# Patient Record
Sex: Male | Born: 1950 | Race: White | Hispanic: No | Marital: Married | Smoking: Never smoker
Health system: Southern US, Community
[De-identification: ages and names within clinical notes are randomized; demographics above are authoritative.]

## PROBLEM LIST (undated history)

## (undated) DIAGNOSIS — I340 Nonrheumatic mitral (valve) insufficiency: Secondary | ICD-10-CM

## (undated) DIAGNOSIS — I7100 Dissection of unspecified site of aorta: Secondary | ICD-10-CM

## (undated) DIAGNOSIS — I10 Essential (primary) hypertension: Secondary | ICD-10-CM

## (undated) DIAGNOSIS — I4891 Unspecified atrial fibrillation: Secondary | ICD-10-CM

## (undated) HISTORY — DX: Dissection of unspecified site of aorta: I71.00

## (undated) HISTORY — DX: Other postprocedural complications and disorders of the circulatory system, not elsewhere classified: I48.91

## (undated) HISTORY — DX: Essential (primary) hypertension: I10

## (undated) HISTORY — DX: Unspecified atrial fibrillation: I48.91

## (undated) HISTORY — DX: Nonrheumatic mitral (valve) insufficiency: I34.0

---

## 2016-04-23 DIAGNOSIS — Z23 Encounter for immunization: Secondary | ICD-10-CM | POA: Diagnosis not present

## 2016-06-20 DIAGNOSIS — Z85828 Personal history of other malignant neoplasm of skin: Secondary | ICD-10-CM | POA: Diagnosis not present

## 2016-06-20 DIAGNOSIS — L821 Other seborrheic keratosis: Secondary | ICD-10-CM | POA: Diagnosis not present

## 2016-06-20 DIAGNOSIS — L570 Actinic keratosis: Secondary | ICD-10-CM | POA: Diagnosis not present

## 2016-07-18 DIAGNOSIS — H40053 Ocular hypertension, bilateral: Secondary | ICD-10-CM | POA: Diagnosis not present

## 2016-07-18 DIAGNOSIS — H2513 Age-related nuclear cataract, bilateral: Secondary | ICD-10-CM | POA: Diagnosis not present

## 2016-07-18 DIAGNOSIS — H40013 Open angle with borderline findings, low risk, bilateral: Secondary | ICD-10-CM | POA: Diagnosis not present

## 2016-07-18 DIAGNOSIS — H25031 Anterior subcapsular polar age-related cataract, right eye: Secondary | ICD-10-CM | POA: Diagnosis not present

## 2016-07-18 DIAGNOSIS — H21233 Degeneration of iris (pigmentary), bilateral: Secondary | ICD-10-CM | POA: Diagnosis not present

## 2016-07-18 DIAGNOSIS — H25011 Cortical age-related cataract, right eye: Secondary | ICD-10-CM | POA: Diagnosis not present

## 2016-08-17 DIAGNOSIS — H21233 Degeneration of iris (pigmentary), bilateral: Secondary | ICD-10-CM | POA: Diagnosis not present

## 2016-08-17 DIAGNOSIS — H25811 Combined forms of age-related cataract, right eye: Secondary | ICD-10-CM | POA: Diagnosis not present

## 2016-08-17 DIAGNOSIS — H25011 Cortical age-related cataract, right eye: Secondary | ICD-10-CM | POA: Diagnosis not present

## 2016-08-17 DIAGNOSIS — H40053 Ocular hypertension, bilateral: Secondary | ICD-10-CM | POA: Diagnosis not present

## 2017-03-26 DIAGNOSIS — Z23 Encounter for immunization: Secondary | ICD-10-CM | POA: Diagnosis not present

## 2017-05-16 DIAGNOSIS — H6692 Otitis media, unspecified, left ear: Secondary | ICD-10-CM | POA: Diagnosis not present

## 2017-05-16 DIAGNOSIS — Z6826 Body mass index (BMI) 26.0-26.9, adult: Secondary | ICD-10-CM | POA: Diagnosis not present

## 2017-06-19 DIAGNOSIS — L570 Actinic keratosis: Secondary | ICD-10-CM | POA: Diagnosis not present

## 2018-04-26 DIAGNOSIS — Z23 Encounter for immunization: Secondary | ICD-10-CM | POA: Diagnosis not present

## 2018-06-18 DIAGNOSIS — Z85828 Personal history of other malignant neoplasm of skin: Secondary | ICD-10-CM | POA: Diagnosis not present

## 2018-06-18 DIAGNOSIS — L821 Other seborrheic keratosis: Secondary | ICD-10-CM | POA: Diagnosis not present

## 2018-06-18 DIAGNOSIS — L570 Actinic keratosis: Secondary | ICD-10-CM | POA: Diagnosis not present

## 2019-06-22 DIAGNOSIS — L821 Other seborrheic keratosis: Secondary | ICD-10-CM | POA: Diagnosis not present

## 2019-06-22 DIAGNOSIS — Z85828 Personal history of other malignant neoplasm of skin: Secondary | ICD-10-CM | POA: Diagnosis not present

## 2019-06-22 DIAGNOSIS — D485 Neoplasm of uncertain behavior of skin: Secondary | ICD-10-CM | POA: Diagnosis not present

## 2019-06-22 DIAGNOSIS — L570 Actinic keratosis: Secondary | ICD-10-CM | POA: Diagnosis not present

## 2019-08-03 DIAGNOSIS — R05 Cough: Secondary | ICD-10-CM | POA: Diagnosis not present

## 2019-08-22 ENCOUNTER — Other Ambulatory Visit: Payer: Self-pay

## 2019-08-22 ENCOUNTER — Inpatient Hospital Stay (HOSPITAL_COMMUNITY)
Admission: EM | Admit: 2019-08-22 | Discharge: 2019-09-01 | DRG: 219 | Disposition: A | Discharge status: Home / Self Care | Payer: Medicare Other | Attending: Cardiothoracic Surgery | Admitting: Cardiothoracic Surgery

## 2019-08-22 ENCOUNTER — Encounter (HOSPITAL_COMMUNITY): Payer: Self-pay

## 2019-08-22 DIAGNOSIS — R918 Other nonspecific abnormal finding of lung field: Secondary | ICD-10-CM | POA: Diagnosis not present

## 2019-08-22 DIAGNOSIS — D688 Other specified coagulation defects: Secondary | ICD-10-CM | POA: Diagnosis not present

## 2019-08-22 DIAGNOSIS — I340 Nonrheumatic mitral (valve) insufficiency: Secondary | ICD-10-CM | POA: Diagnosis present

## 2019-08-22 DIAGNOSIS — R0689 Other abnormalities of breathing: Secondary | ICD-10-CM | POA: Diagnosis not present

## 2019-08-22 DIAGNOSIS — Z9889 Other specified postprocedural states: Secondary | ICD-10-CM

## 2019-08-22 DIAGNOSIS — J90 Pleural effusion, not elsewhere classified: Secondary | ICD-10-CM | POA: Diagnosis not present

## 2019-08-22 DIAGNOSIS — R570 Cardiogenic shock: Secondary | ICD-10-CM | POA: Diagnosis not present

## 2019-08-22 DIAGNOSIS — I471 Supraventricular tachycardia: Secondary | ICD-10-CM | POA: Diagnosis not present

## 2019-08-22 DIAGNOSIS — D696 Thrombocytopenia, unspecified: Secondary | ICD-10-CM | POA: Diagnosis present

## 2019-08-22 DIAGNOSIS — I7101 Dissection of thoracic aorta: Secondary | ICD-10-CM | POA: Diagnosis not present

## 2019-08-22 DIAGNOSIS — I7100 Dissection of unspecified site of aorta: Secondary | ICD-10-CM | POA: Diagnosis present

## 2019-08-22 DIAGNOSIS — F17220 Nicotine dependence, chewing tobacco, uncomplicated: Secondary | ICD-10-CM | POA: Diagnosis present

## 2019-08-22 DIAGNOSIS — J9602 Acute respiratory failure with hypercapnia: Secondary | ICD-10-CM | POA: Diagnosis not present

## 2019-08-22 DIAGNOSIS — I97618 Postprocedural hemorrhage and hematoma of a circulatory system organ or structure following other circulatory system procedure: Secondary | ICD-10-CM | POA: Diagnosis not present

## 2019-08-22 DIAGNOSIS — D62 Acute posthemorrhagic anemia: Secondary | ICD-10-CM | POA: Diagnosis not present

## 2019-08-22 DIAGNOSIS — E8770 Fluid overload, unspecified: Secondary | ICD-10-CM | POA: Diagnosis not present

## 2019-08-22 DIAGNOSIS — Z8616 Personal history of COVID-19: Secondary | ICD-10-CM

## 2019-08-22 DIAGNOSIS — Z452 Encounter for adjustment and management of vascular access device: Secondary | ICD-10-CM

## 2019-08-22 DIAGNOSIS — I71019 Dissection of thoracic aorta, unspecified: Secondary | ICD-10-CM

## 2019-08-22 DIAGNOSIS — I454 Nonspecific intraventricular block: Secondary | ICD-10-CM | POA: Diagnosis present

## 2019-08-22 DIAGNOSIS — I2510 Atherosclerotic heart disease of native coronary artery without angina pectoris: Secondary | ICD-10-CM | POA: Diagnosis not present

## 2019-08-22 DIAGNOSIS — U071 COVID-19: Secondary | ICD-10-CM | POA: Diagnosis not present

## 2019-08-22 DIAGNOSIS — Y838 Other surgical procedures as the cause of abnormal reaction of the patient, or of later complication, without mention of misadventure at the time of the procedure: Secondary | ICD-10-CM | POA: Diagnosis not present

## 2019-08-22 DIAGNOSIS — R001 Bradycardia, unspecified: Secondary | ICD-10-CM | POA: Diagnosis not present

## 2019-08-22 DIAGNOSIS — J9601 Acute respiratory failure with hypoxia: Secondary | ICD-10-CM | POA: Diagnosis not present

## 2019-08-22 DIAGNOSIS — I4891 Unspecified atrial fibrillation: Secondary | ICD-10-CM | POA: Diagnosis not present

## 2019-08-22 DIAGNOSIS — E876 Hypokalemia: Secondary | ICD-10-CM | POA: Diagnosis not present

## 2019-08-22 DIAGNOSIS — I7102 Dissection of abdominal aorta: Secondary | ICD-10-CM | POA: Diagnosis not present

## 2019-08-22 DIAGNOSIS — Z952 Presence of prosthetic heart valve: Secondary | ICD-10-CM | POA: Diagnosis not present

## 2019-08-22 DIAGNOSIS — Z4682 Encounter for fitting and adjustment of non-vascular catheter: Secondary | ICD-10-CM | POA: Diagnosis not present

## 2019-08-22 DIAGNOSIS — R0902 Hypoxemia: Secondary | ICD-10-CM | POA: Diagnosis not present

## 2019-08-22 DIAGNOSIS — I719 Aortic aneurysm of unspecified site, without rupture: Secondary | ICD-10-CM | POA: Diagnosis not present

## 2019-08-22 DIAGNOSIS — R079 Chest pain, unspecified: Secondary | ICD-10-CM | POA: Diagnosis not present

## 2019-08-22 DIAGNOSIS — I712 Thoracic aortic aneurysm, without rupture: Secondary | ICD-10-CM | POA: Diagnosis not present

## 2019-08-22 DIAGNOSIS — J9811 Atelectasis: Secondary | ICD-10-CM | POA: Diagnosis not present

## 2019-08-22 MED ORDER — NITROGLYCERIN IN D5W 200-5 MCG/ML-% IV SOLN
5.0000 ug/min | INTRAVENOUS | Status: DC
  Administered 2019-08-23: 5 ug/min via INTRAVENOUS
  Filled 2019-08-22: qty 250

## 2019-08-22 MED ORDER — ASPIRIN 325 MG PO TABS
325.0000 mg | ORAL_TABLET | Freq: Once | ORAL | Status: AC
  Administered 2019-08-23: 325 mg via ORAL
  Filled 2019-08-22: qty 1

## 2019-08-22 NOTE — ED Triage Notes (Signed)
Pt reports chest pain that started about 8 pm tonight. Pt described pain as heaviness, pressure, tightness rated 6/10 on pain scale. Associated sx include sob, diaphoresis, and mouth/jaw soreness.

## 2019-08-22 NOTE — ED Provider Notes (Signed)
[facility] EMERGENCY DEPARTMENT Provider Note   CSN: [id number] Arrival date & time: 08/22/19  2335   Time seen 11:47 PM  History Chief Complaint  Patient presents with  . Chest Pain    Henry Hayden is a 69 y.o. male.  HPI patient states about 8:30 PM he was sitting in his recliner and had acute onset of pain in the center of his chest that he describes as pressure.  He denies any radiation of pain but does state the roof of his mouth feels sore when he needs to rub his tongue on it.  He denies nausea or vomiting but states he had a lot of burping.  He took Tums without relief.  He states he felt short of breath and about 10 PM he broke out in a sweat.  He states when he walks his legs feel weak.  He states nothing he does makes the pain worse, nothing he did made it feel better.  He states he is never had this pain before.  His current pain is a 6 out of 10, at its worst was a 7 out of 10.  He denies any family history of coronary artery disease.  He states he has a history of borderline blood pressure but is not on medications, he denies diabetes or smoking.  He is not on any prescription medication.  He denies any change of his activity today.  Patient states he tested positive for Covid on December 29 and he had low-grade fevers and a bad cough, he states he was treated with antibiotics and steroids and those symptoms have been gone.  PCP Rory Percy, MD      History reviewed. No pertinent past medical history.  There are no problems to display for this patient.   History reviewed. No pertinent surgical history.     No family history on file.  Social History   Tobacco Use  . Smoking status: Never Smoker  . Smokeless tobacco: Current User    Types: Chew  Substance Use Topics  . Alcohol use: Not Currently  . Drug use: Never  lives at home  Lives with spouse  Home Medications Prior to Admission medications   Not on File    Allergies    Patient has no known  allergies.  Review of Systems   Review of Systems  All other systems reviewed and are negative.   Physical Exam Updated Vital Signs BP 109/70   Pulse 60   Temp (!) 97.5 F (36.4 C) (Oral)   Resp 20   Ht 6' (1.829 m)   Wt 79.8 kg   SpO2 94%   BMI 23.87 kg/m   Physical Exam Vitals and nursing note reviewed.  Constitutional:      General: He is not in acute distress.    Appearance: Normal appearance. He is well-developed. He is not ill-appearing or toxic-appearing.  HENT:     Head: Normocephalic and atraumatic.     Right Ear: External ear normal.     Left Ear: External ear normal.     Nose: Nose normal. No mucosal edema or rhinorrhea.     Mouth/Throat:     Dentition: No dental abscesses.     Pharynx: No uvula swelling.  Eyes:     Extraocular Movements: Extraocular movements intact.     Conjunctiva/sclera: Conjunctivae normal.     Pupils: Pupils are equal, round, and reactive to light.  Cardiovascular:     Rate and Rhythm: Normal rate  and regular rhythm.     Heart sounds: Normal heart sounds. No murmur. No friction rub. No gallop.   Pulmonary:     Effort: Pulmonary effort is normal. No respiratory distress.     Breath sounds: Normal breath sounds. No wheezing, rhonchi or rales.  Chest:     Chest wall: No tenderness or crepitus.       Comments: Area of pain noted Abdominal:     General: Bowel sounds are normal. There is no distension.     Palpations: Abdomen is soft.     Tenderness: There is no abdominal tenderness. There is no guarding or rebound.  Musculoskeletal:        General: No tenderness. Normal range of motion.     Cervical back: Full passive range of motion without pain, normal range of motion and neck supple.     Right lower leg: No edema.     Left lower leg: No edema.     Comments: Moves all extremities well.   Skin:    General: Skin is warm and dry.     Coloration: Skin is not pale.     Findings: No erythema or rash.  Neurological:     General:  No focal deficit present.     Mental Status: He is alert and oriented to person, place, and time.     Cranial Nerves: No cranial nerve deficit.  Psychiatric:        Mood and Affect: Mood normal. Mood is not anxious.        Speech: Speech normal.        Behavior: Behavior normal.        Thought Content: Thought content normal.     ED Results / Procedures / Treatments   Labs (all labs ordered are listed, but only abnormal results are displayed) Results for orders placed or performed during the hospital encounter of 08/22/19  Respiratory Panel by RT PCR (Flu A&B, Covid) - Nasopharyngeal Swab   Specimen: Nasopharyngeal Swab  Result Value Ref Range   SARS Coronavirus 2 by RT PCR POSITIVE (A) NEGATIVE   Influenza A by PCR NEGATIVE NEGATIVE   Influenza B by PCR NEGATIVE NEGATIVE  Comprehensive metabolic panel  Result Value Ref Range   Sodium 135 135 - 145 mmol/L   Potassium 4.0 3.5 - 5.1 mmol/L   Chloride 101 98 - 111 mmol/L   CO2 27 22 - 32 mmol/L   Glucose, Bld 130 (H) 70 - 99 mg/dL   BUN 14 8 - 23 mg/dL   Creatinine, Ser 1.18 0.61 - 1.24 mg/dL   Calcium 8.6 (L) 8.9 - 10.3 mg/dL   Total Protein 7.3 6.5 - 8.1 g/dL   Albumin 4.0 3.5 - 5.0 g/dL   AST 34 15 - 41 U/L   ALT 24 0 - 44 U/L   Alkaline Phosphatase 62 38 - 126 U/L   Total Bilirubin 0.7 0.3 - 1.2 mg/dL   GFR calc non Af Amer >60 >60 mL/min   GFR calc Af Amer >60 >60 mL/min   Anion gap 7 5 - 15  CBC with Differential  Result Value Ref Range   WBC 7.8 4.0 - 10.5 K/uL   RBC 4.46 4.22 - 5.81 MIL/uL   Hemoglobin 14.2 13.0 - 17.0 g/dL   HCT 43.4 39.0 - 52.0 %   MCV 97.3 80.0 - 100.0 fL   MCH 31.8 26.0 - 34.0 pg   MCHC 32.7 30.0 - 36.0 g/dL   RDW 13.2 11.5 -  15.5 %   Platelets 171 150 - 400 K/uL   nRBC 0.0 0.0 - 0.2 %   Neutrophils Relative % 69 %   Neutro Abs 5.4 1.7 - 7.7 K/uL   Lymphocytes Relative 20 %   Lymphs Abs 1.5 0.7 - 4.0 K/uL   Monocytes Relative 9 %   Monocytes Absolute 0.7 0.1 - 1.0 K/uL   Eosinophils  Relative 1 %   Eosinophils Absolute 0.1 0.0 - 0.5 K/uL   Basophils Relative 1 %   Basophils Absolute 0.1 0.0 - 0.1 K/uL   Immature Granulocytes 0 %   Abs Immature Granulocytes 0.03 0.00 - 0.07 K/uL  D-dimer, quantitative  Result Value Ref Range   D-Dimer, Quant 1.86 (H) 0.00 - 0.50 ug/mL-FEU  APTT  Result Value Ref Range   aPTT 27 24 - 36 seconds  Protime-INR  Result Value Ref Range   Prothrombin Time 12.9 11.4 - 15.2 seconds   INR 1.0 0.8 - 1.2  Lipid panel  Result Value Ref Range   Cholesterol 155 0 - 200 mg/dL   Triglycerides 110 <150 mg/dL   HDL 47 >40 mg/dL   Total CHOL/HDL Ratio 3.3 RATIO   VLDL 22 0 - 40 mg/dL   LDL Cholesterol 86 0 - 99 mg/dL  Troponin I (High Sensitivity)  Result Value Ref Range   Troponin I (High Sensitivity) 13 <18 ng/L    Laboratory interpretation all normal except positive D-dimer, still positive Covid test    EKG EKG Interpretation  Date/Time:  Saturday August 22 2019 23:47:40 EST Ventricular Rate:  45 PR Interval:    QRS Duration: 110 QT Interval:  482 QTC Calculation: 417 R Axis:   67 Text Interpretation: Sinus bradycardia Minimal ST elevation, inferior leads Lateral leads No old tracing to compare Confirmed by Rolland Porter [phone]) on 08/23/2019 12:02:38 AM  #2 EKG  EKG Interpretation  Date/Time:  Saturday August 22 2019 23:50:39 EST Ventricular Rate:  53 PR Interval:    QRS Duration: 109 QT Interval:  494 QTC Calculation: 464 R Axis:   69 Text Interpretation: Sinus rhythm ST elevation, consider inferior injury consider lateral injury No significant change since last tracing 3 minutes before Confirmed by Rolland Porter [phone]) on 08/23/2019 12:04:03 AM        Radiology DG Chest Port 1 View  Result Date: 08/23/2019 CLINICAL DATA:  Central chest pain COVID positive EXAM: PORTABLE CHEST 1 VIEW COMPARISON:  None. FINDINGS: The heart size and mediastinal contours are within normal limits. There is prominence of the central pulmonary  vasculature. The visualized skeletal structures are unremarkable. IMPRESSION: Pulmonary vascular congestion. Electronically Signed   By: Prudencio Pair M.D.   On: 08/23/2019 00:23   CT Angio Chest PE W/Cm &/Or Wo Cm  Result Date: 08/23/2019 CLINICAL DATA:  Chest pain. Positive D-dimer. COVID positive in December. EXAM: CT ANGIOGRAPHY CHEST WITH CONTRAST TECHNIQUE: Multidetector CT imaging of the chest was performed using the standard protocol during bolus administration of intravenous contrast. Multiplanar CT image reconstructions and MIPs were obtained to evaluate the vascular anatomy. CONTRAST:  113mL OMNIPAQUE IOHEXOL 350 MG/ML SOLN COMPARISON:  None. FINDINGS: Cardiovascular: Contrast injection is sufficient to demonstrate satisfactory opacification of the pulmonary arteries to the segmental level.The main pulmonary artery is not significantly dilated. 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 The dissection begins approximately 2.5 cm superior to the aortic valve. The dissection is suboptimally evaluated secondary to contrast bolus timing. The arch vessels appear to be grossly patent,  however extension of the dissection flap into these vessels is difficult to entirely exclude. The ascending aorta measures approximately 4.5 cm in diameter and is aneurysmal at this diameter. The dissection appears to terminate at the level of the mid descending thoracic aorta. The dissection does not appear to extend into the abdominal aorta. The heart size is normal. There is no significant pericardial effusion. Advanced coronary artery calcifications are noted. There is some mild reflux of contrast in the IVC. There is significant right atrial enlargement. Mediastinum/Nodes: --No mediastinal or hilar lymphadenopathy. --No axillary lymphadenopathy. --No supraclavicular lymphadenopathy. --Normal thyroid gland. --The esophagus is unremarkable Lungs/Pleura: There are pulmonary opacities in the right lower  lobe. There are few subtle ground-glass airspace opacities in the left lower lobe. There is no pneumothorax or large pleural effusion. The trachea is unremarkable. Upper Abdomen: There is a nonobstructing stone in the upper pole the right kidney. There is some narrowing at the origin of the celiac axis of doubtful clinical significance. Musculoskeletal: No chest wall abnormality. No acute or significant osseous findings. Review of the MIP images confirms the above findings. IMPRESSION: 1. No acute pulmonary embolism. 2. Acute appearing Stanford type A dissection as detailed above. This dissection is suboptimally evaluated on this exam secondary to contrast timing. The arch vessels appear to be grossly patent, but are suboptimally evaluated. 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 3. Subtle reflux of contrast in the IVC suggestive right-sided heart failure. 4. Significant right atrial enlargement is noted. 5. Mild cardiomegaly with coronary artery disease. 6. Bibasilar airspace opacities, right worse than left may represent residual infiltrates in the setting of a prior COVID-19 diagnosis. 7. Nonobstructing stone in the upper pole the right kidney. These results were called by telephone at the time of interpretation on 08/23/2019 at 2:03 am to provider Garnetta Fedrick , who verbally acknowledged these results. Electronically Signed   By: Constance Holster M.D.   On: 08/23/2019 02:07      Procedures .Critical Care Performed by: Rolland Porter, MD Authorized by: Rolland Porter, MD   Critical care provider statement:    Critical care time (minutes):  40   Critical care was necessary to treat or prevent imminent or life-threatening deterioration of the following conditions:  Circulatory failure   Critical care was time spent personally by me on the following activities:  Discussions with consultants, examination of patient, obtaining history from patient or surrogate, ordering and review of  laboratory studies, ordering and review of radiographic studies, pulse oximetry and re-evaluation of patient's condition   (including critical care time)  Medications Ordered in ED Medications  nitroGLYCERIN 50 mg in dextrose 5 % 250 mL (0.2 mg/mL) infusion (20 mcg/min Intravenous Rate/Dose Verify 08/23/19 0207)  0.9 %  sodium chloride infusion (has no administration in time range)  morphine 2 MG/ML injection 2 mg (has no administration in time range)  aspirin tablet 325 mg (325 mg Oral Given 08/23/19 0005)  heparin bolus via infusion 4,000 Units (4,000 Units Intravenous Bolus from Bag 08/23/19 0056)  iohexol (OMNIPAQUE) 350 MG/ML injection 100 mL (100 mLs Intravenous Contrast Given 08/23/19 0147)  protamine injection 50 mg (50 mg Intravenous Given 08/23/19 0218)    ED Course  I have reviewed the triage vital signs and the nursing notes.  Pertinent labs & imaging results that were available during my care of the patient were reviewed by me and considered in my medical decision making (see chart for details).    MDM Rules/Calculators/A&P  Patient has some minor changes on his EKG that are worrisome however he does not meet criteria for STEMI.  Patient was given full dose aspirin and started on nitroglycerin drip to see if that would control his pain.  12:23 AM patient was discussed with cardiologist, Dr. Launa Grill, she has reviewed his EKG and agrees that he is not a STEMI, however his EKG is of concern.  She agrees to starting patient on heparin.  12:56 AM patient is on IV nitroglycerin and starting his heparin drip.  He denies any history of bleeding disorders including vomiting blood or rectal bleeding.  He states his pain is improved and is now a 2 out of 10.  1:15 AM patient's D-dimer is positive, due to his recent Covid infection a CTA chest was done.  His initial troponin was normal.  We will see what the delta troponin looks like.  2:05 AM radiology called, patient has a  Stanford A dissection that stops in the thoracic area, it does not extend into the abdomen, he was unable to see the vasculature due to timing because it was a PE study.  Patient has had about 5000 units of heparin, the drip was stopped and he was started on protamine 50 mg IV.  Thoracic vascular surgery was consulted.  Pharmacy was also consulted about the protamine to see about repeat doses.Marland Kitchen  2:20 AM patient states he did not have any back pain prior to coming to the ED, he states he has some mild discomfort now in his back but he thought it was from having to lay flat on his back and he normally lays on his side.  Patient states he is never had to be on blood pressure medication, he denies any family history of aortic dissection.  His blood pressure has been 0000000 systolic on the nitroglycerin drip.  He continues to have 2 out of 10 chest pain and he was given IV morphine.  2:34 AM Dr. Orvan Seen, cardiothoracic surgery wants patient transported to [facility] to be admitted to [facility] ICU.  2:55 AM patient was leaving with EMS and a nurse in attendance.  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

## 2019-08-23 ENCOUNTER — Emergency Department (HOSPITAL_COMMUNITY): Payer: Medicare Other

## 2019-08-23 ENCOUNTER — Inpatient Hospital Stay (HOSPITAL_COMMUNITY): Payer: Medicare Other

## 2019-08-23 ENCOUNTER — Encounter (HOSPITAL_COMMUNITY)
Admission: EM | Disposition: A | Discharge status: Home / Self Care | Payer: Self-pay | Source: Home / Self Care | Attending: Cardiothoracic Surgery

## 2019-08-23 ENCOUNTER — Inpatient Hospital Stay (HOSPITAL_COMMUNITY): Payer: Medicare Other | Admitting: Registered Nurse

## 2019-08-23 DIAGNOSIS — Z8616 Personal history of COVID-19: Secondary | ICD-10-CM | POA: Diagnosis not present

## 2019-08-23 DIAGNOSIS — I454 Nonspecific intraventricular block: Secondary | ICD-10-CM | POA: Diagnosis present

## 2019-08-23 DIAGNOSIS — Y838 Other surgical procedures as the cause of abnormal reaction of the patient, or of later complication, without mention of misadventure at the time of the procedure: Secondary | ICD-10-CM | POA: Diagnosis not present

## 2019-08-23 DIAGNOSIS — E8770 Fluid overload, unspecified: Secondary | ICD-10-CM | POA: Diagnosis not present

## 2019-08-23 DIAGNOSIS — F17220 Nicotine dependence, chewing tobacco, uncomplicated: Secondary | ICD-10-CM | POA: Diagnosis present

## 2019-08-23 DIAGNOSIS — J9602 Acute respiratory failure with hypercapnia: Secondary | ICD-10-CM | POA: Diagnosis not present

## 2019-08-23 DIAGNOSIS — I4891 Unspecified atrial fibrillation: Secondary | ICD-10-CM | POA: Diagnosis not present

## 2019-08-23 DIAGNOSIS — D688 Other specified coagulation defects: Secondary | ICD-10-CM | POA: Diagnosis not present

## 2019-08-23 DIAGNOSIS — I97618 Postprocedural hemorrhage and hematoma of a circulatory system organ or structure following other circulatory system procedure: Secondary | ICD-10-CM | POA: Diagnosis not present

## 2019-08-23 DIAGNOSIS — R570 Cardiogenic shock: Secondary | ICD-10-CM | POA: Diagnosis not present

## 2019-08-23 DIAGNOSIS — J9601 Acute respiratory failure with hypoxia: Secondary | ICD-10-CM | POA: Diagnosis not present

## 2019-08-23 DIAGNOSIS — I471 Supraventricular tachycardia: Secondary | ICD-10-CM | POA: Diagnosis not present

## 2019-08-23 DIAGNOSIS — D62 Acute posthemorrhagic anemia: Secondary | ICD-10-CM | POA: Diagnosis not present

## 2019-08-23 DIAGNOSIS — D696 Thrombocytopenia, unspecified: Secondary | ICD-10-CM | POA: Diagnosis present

## 2019-08-23 DIAGNOSIS — I2510 Atherosclerotic heart disease of native coronary artery without angina pectoris: Secondary | ICD-10-CM | POA: Diagnosis present

## 2019-08-23 DIAGNOSIS — I7102 Dissection of abdominal aorta: Secondary | ICD-10-CM

## 2019-08-23 DIAGNOSIS — Z9889 Other specified postprocedural states: Secondary | ICD-10-CM

## 2019-08-23 DIAGNOSIS — J90 Pleural effusion, not elsewhere classified: Secondary | ICD-10-CM | POA: Diagnosis not present

## 2019-08-23 DIAGNOSIS — I340 Nonrheumatic mitral (valve) insufficiency: Secondary | ICD-10-CM | POA: Diagnosis not present

## 2019-08-23 DIAGNOSIS — I7101 Dissection of thoracic aorta: Secondary | ICD-10-CM | POA: Diagnosis present

## 2019-08-23 DIAGNOSIS — I7100 Dissection of unspecified site of aorta: Secondary | ICD-10-CM | POA: Diagnosis present

## 2019-08-23 DIAGNOSIS — E876 Hypokalemia: Secondary | ICD-10-CM | POA: Diagnosis not present

## 2019-08-23 HISTORY — PX: AORTIC VALVE REPAIR: SHX6306

## 2019-08-23 HISTORY — PX: MITRAL VALVE REPAIR: SHX2039

## 2019-08-23 LAB — GLUCOSE, CAPILLARY
Glucose-Capillary: 218 mg/dL — ABNORMAL HIGH (ref 70–99)
Glucose-Capillary: 172 mg/dL — ABNORMAL HIGH (ref 70–99)
Glucose-Capillary: 171 mg/dL — ABNORMAL HIGH (ref 70–99)
Glucose-Capillary: 144 mg/dL — ABNORMAL HIGH (ref 70–99)
Glucose-Capillary: 157 mg/dL — ABNORMAL HIGH (ref 70–99)
Glucose-Capillary: 148 mg/dL — ABNORMAL HIGH (ref 70–99)

## 2019-08-23 LAB — POCT I-STAT, CHEM 8
BUN: 12 mg/dL (ref 8–23)
BUN: 11 mg/dL (ref 8–23)
BUN: 13 mg/dL (ref 8–23)
BUN: 11 mg/dL (ref 8–23)
Calcium, Ion: 1.23 mmol/L (ref 1.15–1.40)
Calcium, Ion: 1.19 mmol/L (ref 1.15–1.40)
Calcium, Ion: 1.05 mmol/L — ABNORMAL LOW (ref 1.15–1.40)
Calcium, Ion: 1.52 mmol/L (ref 1.15–1.40)
Chloride: 103 mmol/L (ref 98–111)
Chloride: 103 mmol/L (ref 98–111)
Chloride: 104 mmol/L (ref 98–111)
Chloride: 106 mmol/L (ref 98–111)
Creatinine, Ser: 0.9 mg/dL (ref 0.61–1.24)
Creatinine, Ser: 0.9 mg/dL (ref 0.61–1.24)
Creatinine, Ser: 0.7 mg/dL (ref 0.61–1.24)
Creatinine, Ser: 0.7 mg/dL (ref 0.61–1.24)
Glucose, Bld: 113 mg/dL — ABNORMAL HIGH (ref 70–99)
Glucose, Bld: 112 mg/dL — ABNORMAL HIGH (ref 70–99)
Glucose, Bld: 186 mg/dL — ABNORMAL HIGH (ref 70–99)
Glucose, Bld: 143 mg/dL — ABNORMAL HIGH (ref 70–99)
HCT: 35 % — ABNORMAL LOW (ref 39.0–52.0)
HCT: 30 % — ABNORMAL LOW (ref 39.0–52.0)
HCT: 26 % — ABNORMAL LOW (ref 39.0–52.0)
HCT: 25 % — ABNORMAL LOW (ref 39.0–52.0)
Hemoglobin: 11.9 g/dL — ABNORMAL LOW (ref 13.0–17.0)
Hemoglobin: 10.2 g/dL — ABNORMAL LOW (ref 13.0–17.0)
Hemoglobin: 8.8 g/dL — ABNORMAL LOW (ref 13.0–17.0)
Hemoglobin: 8.5 g/dL — ABNORMAL LOW (ref 13.0–17.0)
Potassium: 4.4 mmol/L (ref 3.5–5.1)
Potassium: 4.6 mmol/L (ref 3.5–5.1)
Potassium: 4.5 mmol/L (ref 3.5–5.1)
Potassium: 3.4 mmol/L — ABNORMAL LOW (ref 3.5–5.1)
Sodium: 138 mmol/L (ref 135–145)
Sodium: 136 mmol/L (ref 135–145)
Sodium: 134 mmol/L — ABNORMAL LOW (ref 135–145)
Sodium: 137 mmol/L (ref 135–145)
TCO2: 26 mmol/L (ref 22–32)
TCO2: 25 mmol/L (ref 22–32)
TCO2: 26 mmol/L (ref 22–32)
TCO2: 22 mmol/L (ref 22–32)

## 2019-08-23 LAB — CBC
HCT: 28 % — ABNORMAL LOW (ref 39.0–52.0)
Hemoglobin: 9.1 g/dL — ABNORMAL LOW (ref 13.0–17.0)
MCH: 30.4 pg (ref 26.0–34.0)
MCHC: 32.5 g/dL (ref 30.0–36.0)
MCV: 93.6 fL (ref 80.0–100.0)
Platelets: 44 10*3/uL — ABNORMAL LOW (ref 150–400)
RBC: 2.99 MIL/uL — ABNORMAL LOW (ref 4.22–5.81)
RDW: 15 % (ref 11.5–15.5)
WBC: 9.1 10*3/uL (ref 4.0–10.5)
nRBC: 0 % (ref 0.0–0.2)

## 2019-08-23 LAB — POCT I-STAT 7, (LYTES, BLD GAS, ICA,H+H)
Acid-base deficit: 2 mmol/L (ref 0.0–2.0)
Acid-base deficit: 6 mmol/L — ABNORMAL HIGH (ref 0.0–2.0)
Acid-base deficit: 6 mmol/L — ABNORMAL HIGH (ref 0.0–2.0)
Bicarbonate: 25.3 mmol/L (ref 20.0–28.0)
Bicarbonate: 25.6 mmol/L (ref 20.0–28.0)
Bicarbonate: 24.8 mmol/L (ref 20.0–28.0)
Bicarbonate: 20.8 mmol/L (ref 20.0–28.0)
Bicarbonate: 20.7 mmol/L (ref 20.0–28.0)
Calcium, Ion: 1.25 mmol/L (ref 1.15–1.40)
Calcium, Ion: 1.07 mmol/L — ABNORMAL LOW (ref 1.15–1.40)
Calcium, Ion: 1.11 mmol/L — ABNORMAL LOW (ref 1.15–1.40)
Calcium, Ion: 1.46 mmol/L — ABNORMAL HIGH (ref 1.15–1.40)
Calcium, Ion: 1.23 mmol/L (ref 1.15–1.40)
HCT: 38 % — ABNORMAL LOW (ref 39.0–52.0)
HCT: 28 % — ABNORMAL LOW (ref 39.0–52.0)
HCT: 26 % — ABNORMAL LOW (ref 39.0–52.0)
HCT: 23 % — ABNORMAL LOW (ref 39.0–52.0)
HCT: 26 % — ABNORMAL LOW (ref 39.0–52.0)
Hemoglobin: 12.9 g/dL — ABNORMAL LOW (ref 13.0–17.0)
Hemoglobin: 9.5 g/dL — ABNORMAL LOW (ref 13.0–17.0)
Hemoglobin: 8.8 g/dL — ABNORMAL LOW (ref 13.0–17.0)
Hemoglobin: 7.8 g/dL — ABNORMAL LOW (ref 13.0–17.0)
Hemoglobin: 8.8 g/dL — ABNORMAL LOW (ref 13.0–17.0)
O2 Saturation: 96 %
O2 Saturation: 100 %
O2 Saturation: 100 %
O2 Saturation: 100 %
O2 Saturation: 100 %
Patient temperature: 98.6
Potassium: 4.3 mmol/L (ref 3.5–5.1)
Potassium: 4.8 mmol/L (ref 3.5–5.1)
Potassium: 4.5 mmol/L (ref 3.5–5.1)
Potassium: 3.4 mmol/L — ABNORMAL LOW (ref 3.5–5.1)
Potassium: 3.3 mmol/L — ABNORMAL LOW (ref 3.5–5.1)
Sodium: 137 mmol/L (ref 135–145)
Sodium: 137 mmol/L (ref 135–145)
Sodium: 135 mmol/L (ref 135–145)
Sodium: 138 mmol/L (ref 135–145)
Sodium: 140 mmol/L (ref 135–145)
TCO2: 27 mmol/L (ref 22–32)
TCO2: 27 mmol/L (ref 22–32)
TCO2: 26 mmol/L (ref 22–32)
TCO2: 22 mmol/L (ref 22–32)
TCO2: 22 mmol/L (ref 22–32)
pCO2 arterial: 41.7 mmHg (ref 32.0–48.0)
pCO2 arterial: 44.7 mmHg (ref 32.0–48.0)
pCO2 arterial: 50.8 mmHg — ABNORMAL HIGH (ref 32.0–48.0)
pCO2 arterial: 45.9 mmHg (ref 32.0–48.0)
pCO2 arterial: 48 mmHg (ref 32.0–48.0)
pH, Arterial: 7.391 (ref 7.350–7.450)
pH, Arterial: 7.367 (ref 7.350–7.450)
pH, Arterial: 7.296 — ABNORMAL LOW (ref 7.350–7.450)
pH, Arterial: 7.264 — ABNORMAL LOW (ref 7.350–7.450)
pH, Arterial: 7.243 — ABNORMAL LOW (ref 7.350–7.450)
pO2, Arterial: 80 mmHg — ABNORMAL LOW (ref 83.0–108.0)
pO2, Arterial: 344 mmHg — ABNORMAL HIGH (ref 83.0–108.0)
pO2, Arterial: 567 mmHg — ABNORMAL HIGH (ref 83.0–108.0)
pO2, Arterial: 202 mmHg — ABNORMAL HIGH (ref 83.0–108.0)
pO2, Arterial: 404 mmHg — ABNORMAL HIGH (ref 83.0–108.0)

## 2019-08-23 LAB — CBC WITH DIFFERENTIAL/PLATELET
Abs Immature Granulocytes: 0.03 10*3/uL (ref 0.00–0.07)
Basophils Absolute: 0.1 10*3/uL (ref 0.0–0.1)
Basophils Relative: 1 %
Eosinophils Absolute: 0.1 10*3/uL (ref 0.0–0.5)
Eosinophils Relative: 1 %
HCT: 43.4 % (ref 39.0–52.0)
Hemoglobin: 14.2 g/dL (ref 13.0–17.0)
Immature Granulocytes: 0 %
Lymphocytes Relative: 20 %
Lymphs Abs: 1.5 10*3/uL (ref 0.7–4.0)
MCH: 31.8 pg (ref 26.0–34.0)
MCHC: 32.7 g/dL (ref 30.0–36.0)
MCV: 97.3 fL (ref 80.0–100.0)
Monocytes Absolute: 0.7 10*3/uL (ref 0.1–1.0)
Monocytes Relative: 9 %
Neutro Abs: 5.4 10*3/uL (ref 1.7–7.7)
Neutrophils Relative %: 69 %
Platelets: 171 10*3/uL (ref 150–400)
RBC: 4.46 MIL/uL (ref 4.22–5.81)
RDW: 13.2 % (ref 11.5–15.5)
WBC: 7.8 10*3/uL (ref 4.0–10.5)
nRBC: 0 % (ref 0.0–0.2)

## 2019-08-23 LAB — COMPREHENSIVE METABOLIC PANEL
ALT: 24 U/L (ref 0–44)
AST: 34 U/L (ref 15–41)
Albumin: 4 g/dL (ref 3.5–5.0)
Alkaline Phosphatase: 62 U/L (ref 38–126)
Anion gap: 7 (ref 5–15)
BUN: 14 mg/dL (ref 8–23)
CO2: 27 mmol/L (ref 22–32)
Calcium: 8.6 mg/dL — ABNORMAL LOW (ref 8.9–10.3)
Chloride: 101 mmol/L (ref 98–111)
Creatinine, Ser: 1.18 mg/dL (ref 0.61–1.24)
GFR calc Af Amer: >60 mL/min (ref >60)
GFR calc non Af Amer: >60 mL/min (ref >60)
Glucose, Bld: 130 mg/dL — ABNORMAL HIGH (ref 70–99)
Potassium: 4 mmol/L (ref 3.5–5.1)
Sodium: 135 mmol/L (ref 135–145)
Total Bilirubin: 0.7 mg/dL (ref 0.3–1.2)
Total Protein: 7.3 g/dL (ref 6.5–8.1)

## 2019-08-23 LAB — RESPIRATORY PANEL BY RT PCR (FLU A&B, COVID)
Influenza A by PCR: NEGATIVE
Influenza B by PCR: NEGATIVE
SARS Coronavirus 2 by RT PCR: POSITIVE — AB

## 2019-08-23 LAB — BASIC METABOLIC PANEL
Anion gap: 9 (ref 5–15)
BUN: 10 mg/dL (ref 8–23)
CO2: 20 mmol/L — ABNORMAL LOW (ref 22–32)
Calcium: 8.8 mg/dL — ABNORMAL LOW (ref 8.9–10.3)
Chloride: 113 mmol/L — ABNORMAL HIGH (ref 98–111)
Creatinine, Ser: 1.06 mg/dL (ref 0.61–1.24)
GFR calc Af Amer: >60 mL/min (ref >60)
GFR calc non Af Amer: >60 mL/min (ref >60)
Glucose, Bld: 160 mg/dL — ABNORMAL HIGH (ref 70–99)
Potassium: 4.8 mmol/L (ref 3.5–5.1)
Sodium: 142 mmol/L (ref 135–145)

## 2019-08-23 LAB — PROTIME-INR
INR: 1 (ref 0.8–1.2)
INR: 1.8 — ABNORMAL HIGH (ref 0.8–1.2)
Prothrombin Time: 12.9 seconds (ref 11.4–15.2)
Prothrombin Time: 20.9 seconds — ABNORMAL HIGH (ref 11.4–15.2)

## 2019-08-23 LAB — LIPID PANEL
Cholesterol: 155 mg/dL (ref 0–200)
HDL: 47 mg/dL (ref >40)
LDL Cholesterol: 86 mg/dL (ref 0–99)
Total CHOL/HDL Ratio: 3.3 RATIO
Triglycerides: 110 mg/dL (ref <150)
VLDL: 22 mg/dL (ref 0–40)

## 2019-08-23 LAB — HEMOGLOBIN A1C
Hgb A1c MFr Bld: 5.8 % — ABNORMAL HIGH (ref 4.8–5.6)
Mean Plasma Glucose: 119.76 mg/dL

## 2019-08-23 LAB — SURGICAL PCR SCREEN
MRSA, PCR: NEGATIVE
Staphylococcus aureus: NEGATIVE

## 2019-08-23 LAB — HEMOGLOBIN AND HEMATOCRIT, BLOOD
HCT: 27.2 % — ABNORMAL LOW (ref 39.0–52.0)
Hemoglobin: 9.2 g/dL — ABNORMAL LOW (ref 13.0–17.0)

## 2019-08-23 LAB — PLATELET COUNT: Platelets: 88 10*3/uL — ABNORMAL LOW (ref 150–400)

## 2019-08-23 LAB — FIBRINOGEN: Fibrinogen: 180 mg/dL — ABNORMAL LOW (ref 210–475)

## 2019-08-23 LAB — APTT
aPTT: 27 seconds (ref 24–36)
aPTT: 76 seconds — ABNORMAL HIGH (ref 24–36)

## 2019-08-23 LAB — MAGNESIUM: Magnesium: 2.1 mg/dL (ref 1.7–2.4)

## 2019-08-23 LAB — PREPARE RBC (CROSSMATCH)

## 2019-08-23 LAB — D-DIMER, QUANTITATIVE: D-Dimer, Quant: 1.86 ug/mL-FEU — ABNORMAL HIGH (ref 0.00–0.50)

## 2019-08-23 LAB — ABO/RH: ABO/RH(D): O POS

## 2019-08-23 LAB — HIV ANTIBODY (ROUTINE TESTING W REFLEX): HIV Screen 4th Generation wRfx: NONREACTIVE

## 2019-08-23 LAB — TROPONIN I (HIGH SENSITIVITY)
Troponin I (High Sensitivity): 13 ng/L (ref <18)
Troponin I (High Sensitivity): 8 ng/L (ref <18)

## 2019-08-23 SURGERY — REPAIR, AORTIC VALVE
Anesthesia: General

## 2019-08-23 MED ORDER — VASOPRESSIN 20 UNIT/ML IV SOLN
INTRAVENOUS | Status: AC
  Filled 2019-08-23: qty 1

## 2019-08-23 MED ORDER — LACTATED RINGERS IV SOLN: INTRAVENOUS | Status: DC

## 2019-08-23 MED ORDER — VANCOMYCIN HCL 1250 MG/250ML IV SOLN
1250.0000 mg | INTRAVENOUS | Status: AC
  Administered 2019-08-23: 06:00:00 1250 mg via INTRAVENOUS
  Filled 2019-08-23: qty 250

## 2019-08-23 MED ORDER — INSULIN REGULAR(HUMAN) IN NACL 100-0.9 UT/100ML-% IV SOLN
INTRAVENOUS | Status: AC
  Administered 2019-08-23: 1 [IU]/h via INTRAVENOUS
  Filled 2019-08-23: qty 100

## 2019-08-23 MED ORDER — PROPOFOL 10 MG/ML IV BOLUS
INTRAVENOUS | Status: AC
  Filled 2019-08-23: qty 20

## 2019-08-23 MED ORDER — LACTATED RINGERS IV SOLN: INTRAVENOUS | Status: DC | PRN

## 2019-08-23 MED ORDER — FENTANYL CITRATE (PF) 100 MCG/2ML IJ SOLN
50.0000 ug | INTRAMUSCULAR | Status: DC | PRN
  Administered 2019-08-23: 50 ug via INTRAVENOUS
  Filled 2019-08-23: qty 2

## 2019-08-23 MED ORDER — SODIUM CHLORIDE 0.9 % IV SOLN
1.5000 g | Freq: Two times a day (BID) | INTRAVENOUS | Status: AC
  Administered 2019-08-23 – 2019-08-25 (×4): 1.5 g via INTRAVENOUS
  Filled 2019-08-23 (×4): qty 1.5

## 2019-08-23 MED ORDER — MIDAZOLAM HCL 5 MG/5ML IJ SOLN
INTRAMUSCULAR | Status: DC | PRN
  Administered 2019-08-23: 2 mg via INTRAVENOUS
  Administered 2019-08-23: 3 mg via INTRAVENOUS
  Administered 2019-08-23: 2 mg via INTRAVENOUS
  Administered 2019-08-23: 1 mg via INTRAVENOUS
  Administered 2019-08-23: 2 mg via INTRAVENOUS

## 2019-08-23 MED ORDER — ACETAMINOPHEN 500 MG PO TABS
1000.0000 mg | ORAL_TABLET | Freq: Four times a day (QID) | ORAL | Status: AC
  Administered 2019-08-24 – 2019-08-28 (×19): 1000 mg via ORAL
  Filled 2019-08-23 (×20): qty 2

## 2019-08-23 MED ORDER — IOHEXOL 350 MG/ML SOLN
100.0000 mL | Freq: Once | INTRAVENOUS | Status: AC | PRN
  Administered 2019-08-23: 100 mL via INTRAVENOUS

## 2019-08-23 MED ORDER — DOPAMINE-DEXTROSE 3.2-5 MG/ML-% IV SOLN
0.0000 ug/kg/min | INTRAVENOUS | Status: DC
  Filled 2019-08-23: qty 250

## 2019-08-23 MED ORDER — EPINEPHRINE PF 1 MG/ML IJ SOLN
0.0000 ug/min | INTRAVENOUS | Status: DC
  Filled 2019-08-23: qty 4

## 2019-08-23 MED ORDER — DEXMEDETOMIDINE HCL IN NACL 400 MCG/100ML IV SOLN
0.0000 ug/kg/h | INTRAVENOUS | Status: DC
  Administered 2019-08-23: 0.5 ug/kg/h via INTRAVENOUS
  Filled 2019-08-23: qty 100

## 2019-08-23 MED ORDER — ALBUMIN HUMAN 5 % IV SOLN: INTRAVENOUS | Status: DC | PRN

## 2019-08-23 MED ORDER — SODIUM CHLORIDE 0.9 % IV SOLN: INTRAVENOUS | Status: DC

## 2019-08-23 MED ORDER — VANCOMYCIN HCL 1000 MG IV SOLR
INTRAVENOUS | Status: AC
  Filled 2019-08-23: qty 3000

## 2019-08-23 MED ORDER — MIDAZOLAM HCL 2 MG/2ML IJ SOLN: 2.0000 mg | INTRAMUSCULAR | Status: DC | PRN

## 2019-08-23 MED ORDER — FENTANYL CITRATE (PF) 250 MCG/5ML IJ SOLN
INTRAMUSCULAR | Status: AC
  Filled 2019-08-23: qty 5

## 2019-08-23 MED ORDER — PANTOPRAZOLE SODIUM 40 MG IV SOLR: 40.0000 mg | Freq: Every day | INTRAVENOUS | Status: DC

## 2019-08-23 MED ORDER — TRAMADOL HCL 50 MG PO TABS
50.0000 mg | ORAL_TABLET | ORAL | Status: DC | PRN
  Administered 2019-08-24: 100 mg via ORAL
  Administered 2019-08-25: 50 mg via ORAL
  Filled 2019-08-23: qty 1
  Filled 2019-08-23: qty 2

## 2019-08-23 MED ORDER — ROCURONIUM BROMIDE 10 MG/ML (PF) SYRINGE
PREFILLED_SYRINGE | INTRAVENOUS | Status: DC | PRN
  Administered 2019-08-23: 100 mg via INTRAVENOUS
  Administered 2019-08-23: 50 mg via INTRAVENOUS

## 2019-08-23 MED ORDER — POTASSIUM CHLORIDE 2 MEQ/ML IV SOLN
80.0000 meq | INTRAVENOUS | Status: DC
  Filled 2019-08-23: qty 40

## 2019-08-23 MED ORDER — MANNITOL 20 % IV SOLN
Freq: Once | INTRAVENOUS | Status: DC
  Filled 2019-08-23: qty 13

## 2019-08-23 MED ORDER — FENTANYL CITRATE (PF) 250 MCG/5ML IJ SOLN
INTRAMUSCULAR | Status: AC
  Filled 2019-08-23: qty 10

## 2019-08-23 MED ORDER — EPINEPHRINE PF 1 MG/ML IJ SOLN
0.0000 ug/min | INTRAVENOUS | Status: AC
  Administered 2019-08-23: 10:00:00 3 ug/min via INTRAVENOUS
  Filled 2019-08-23 (×2): qty 4

## 2019-08-23 MED ORDER — METOPROLOL TARTRATE 12.5 MG HALF TABLET: 12.5000 mg | ORAL_TABLET | Freq: Two times a day (BID) | ORAL | Status: DC

## 2019-08-23 MED ORDER — FENTANYL CITRATE (PF) 100 MCG/2ML IJ SOLN
12.5000 ug | INTRAMUSCULAR | Status: DC | PRN
  Administered 2019-08-24: 12.5 ug via INTRAVENOUS
  Filled 2019-08-23: qty 2

## 2019-08-23 MED ORDER — HEPARIN BOLUS VIA INFUSION
4000.0000 [IU] | Freq: Once | INTRAVENOUS | Status: AC
  Administered 2019-08-23: 4000 [IU] via INTRAVENOUS

## 2019-08-23 MED ORDER — INSULIN REGULAR(HUMAN) IN NACL 100-0.9 UT/100ML-% IV SOLN: INTRAVENOUS | Status: DC

## 2019-08-23 MED ORDER — POTASSIUM CHLORIDE 10 MEQ/50ML IV SOLN
10.0000 meq | INTRAVENOUS | Status: AC
  Administered 2019-08-23 (×3): 10 meq via INTRAVENOUS

## 2019-08-23 MED ORDER — NOREPINEPHRINE 4 MG/250ML-% IV SOLN
0.0000 ug/min | INTRAVENOUS | Status: DC
  Administered 2019-08-23: 9 ug/min via INTRAVENOUS
  Filled 2019-08-23 (×2): qty 250

## 2019-08-23 MED ORDER — SODIUM CHLORIDE 0.9 % IV SOLN
1.5000 g | INTRAVENOUS | Status: AC
  Administered 2019-08-23: 10:00:00 .75 g via INTRAVENOUS
  Administered 2019-08-23: 1.5 g via INTRAVENOUS
  Filled 2019-08-23: qty 1.5

## 2019-08-23 MED ORDER — VANCOMYCIN HCL 1000 MG IV SOLR
INTRAVENOUS | Status: AC
  Filled 2019-08-23: qty 1000

## 2019-08-23 MED ORDER — CHLORHEXIDINE GLUCONATE CLOTH 2 % EX PADS: 6.0000 | MEDICATED_PAD | Freq: Once | CUTANEOUS | Status: DC

## 2019-08-23 MED ORDER — ONDANSETRON HCL 4 MG/2ML IJ SOLN: 4.0000 mg | Freq: Four times a day (QID) | INTRAMUSCULAR | Status: DC | PRN

## 2019-08-23 MED ORDER — TEMAZEPAM 15 MG PO CAPS: 15.0000 mg | ORAL_CAPSULE | Freq: Once | ORAL | Status: DC | PRN

## 2019-08-23 MED ORDER — VANCOMYCIN HCL IN DEXTROSE 1-5 GM/200ML-% IV SOLN
1000.0000 mg | Freq: Once | INTRAVENOUS | Status: AC
  Administered 2019-08-23: 1000 mg via INTRAVENOUS

## 2019-08-23 MED ORDER — ASPIRIN 81 MG PO CHEW: 324.0000 mg | CHEWABLE_TABLET | Freq: Every day | ORAL | Status: DC

## 2019-08-23 MED ORDER — ASPIRIN EC 325 MG PO TBEC: 325.0000 mg | DELAYED_RELEASE_TABLET | Freq: Every day | ORAL | Status: DC

## 2019-08-23 MED ORDER — SODIUM CHLORIDE 0.9% FLUSH
3.0000 mL | Freq: Two times a day (BID) | INTRAVENOUS | Status: DC
  Administered 2019-08-24 – 2019-08-26 (×5): 3 mL via INTRAVENOUS

## 2019-08-23 MED ORDER — 0.9 % SODIUM CHLORIDE (POUR BTL) OPTIME
TOPICAL | Status: DC | PRN
  Administered 2019-08-23: 5000 mL

## 2019-08-23 MED ORDER — SODIUM CHLORIDE 0.9% IV SOLUTION: Freq: Once | INTRAVENOUS | Status: AC

## 2019-08-23 MED ORDER — ACETAMINOPHEN 160 MG/5ML PO SOLN: 1000.0000 mg | Freq: Four times a day (QID) | ORAL | Status: AC

## 2019-08-23 MED ORDER — BISACODYL 10 MG RE SUPP
10.0000 mg | Freq: Every day | RECTAL | Status: DC
  Filled 2019-08-23: qty 1

## 2019-08-23 MED ORDER — ACETAMINOPHEN 160 MG/5ML PO SOLN: 650.0000 mg | Freq: Once | ORAL | Status: AC

## 2019-08-23 MED ORDER — SODIUM CHLORIDE 0.9 % IV SOLN: 250.0000 mL | INTRAVENOUS | Status: DC

## 2019-08-23 MED ORDER — ACETAMINOPHEN 325 MG PO TABS: 650.0000 mg | ORAL_TABLET | ORAL | Status: DC | PRN

## 2019-08-23 MED ORDER — LACTATED RINGERS IV SOLN: 500.0000 mL | Freq: Once | INTRAVENOUS | Status: DC | PRN

## 2019-08-23 MED ORDER — PHENYLEPHRINE HCL-NACL 20-0.9 MG/250ML-% IV SOLN
30.0000 ug/min | INTRAVENOUS | Status: AC
  Administered 2019-08-23: 10:00:00 25 ug/min via INTRAVENOUS
  Filled 2019-08-23: qty 250

## 2019-08-23 MED ORDER — TRANEXAMIC ACID (OHS) PUMP PRIME SOLUTION
2.0000 mg/kg | INTRAVENOUS | Status: DC
  Filled 2019-08-23: qty 1.6

## 2019-08-23 MED ORDER — CHLORHEXIDINE GLUCONATE CLOTH 2 % EX PADS
6.0000 | MEDICATED_PAD | Freq: Once | CUTANEOUS | Status: AC
  Administered 2019-08-23: 6 via TOPICAL

## 2019-08-23 MED ORDER — PROPOFOL 10 MG/ML IV BOLUS
INTRAVENOUS | Status: DC | PRN
  Administered 2019-08-23 (×4): 50 mg via INTRAVENOUS

## 2019-08-23 MED ORDER — MAGNESIUM SULFATE 4 GM/100ML IV SOLN
4.0000 g | Freq: Once | INTRAVENOUS | Status: AC
  Administered 2019-08-23: 4 g via INTRAVENOUS
  Filled 2019-08-23: qty 100

## 2019-08-23 MED ORDER — NITROGLYCERIN IN D5W 200-5 MCG/ML-% IV SOLN: 0.0000 ug/min | INTRAVENOUS | Status: DC

## 2019-08-23 MED ORDER — CHLORHEXIDINE GLUCONATE 0.12 % MT SOLN: 15.0000 mL | Freq: Once | OROMUCOSAL | Status: DC

## 2019-08-23 MED ORDER — HEPARIN (PORCINE) 25000 UT/250ML-% IV SOLN
950.0000 [IU]/h | INTRAVENOUS | Status: DC
  Administered 2019-08-23: 950 [IU]/h via INTRAVENOUS
  Filled 2019-08-23: qty 250

## 2019-08-23 MED ORDER — METOPROLOL TARTRATE 25 MG/10 ML ORAL SUSPENSION: 12.5000 mg | Freq: Two times a day (BID) | ORAL | Status: DC

## 2019-08-23 MED ORDER — SODIUM CHLORIDE 0.9 % IV SOLN
750.0000 mg | INTRAVENOUS | Status: DC
  Filled 2019-08-23: qty 750

## 2019-08-23 MED ORDER — CHLORHEXIDINE GLUCONATE CLOTH 2 % EX PADS
6.0000 | MEDICATED_PAD | Freq: Every day | CUTANEOUS | Status: DC
  Administered 2019-08-23 – 2019-09-01 (×8): 6 via TOPICAL

## 2019-08-23 MED ORDER — COAGULATION FACTOR VIIA RECOMB 1 MG IV SOLR
90.0000 ug/kg | Freq: Once | INTRAVENOUS | Status: AC
  Administered 2019-08-23: 12:00:00 1 mg via INTRAVENOUS
  Filled 2019-08-23: qty 5

## 2019-08-23 MED ORDER — MILRINONE LACTATE IN DEXTROSE 20-5 MG/100ML-% IV SOLN
0.3000 ug/kg/min | INTRAVENOUS | Status: AC
  Administered 2019-08-23: .25 ug/kg/min via INTRAVENOUS
  Filled 2019-08-23: qty 100

## 2019-08-23 MED ORDER — SODIUM CHLORIDE 0.45 % IV SOLN: INTRAVENOUS | Status: DC | PRN

## 2019-08-23 MED ORDER — FENTANYL CITRATE (PF) 250 MCG/5ML IJ SOLN
INTRAMUSCULAR | Status: DC | PRN
  Administered 2019-08-23: 100 ug via INTRAVENOUS
  Administered 2019-08-23: 50 ug via INTRAVENOUS
  Administered 2019-08-23: 150 ug via INTRAVENOUS
  Administered 2019-08-23: 700 ug via INTRAVENOUS
  Administered 2019-08-23: 200 ug via INTRAVENOUS
  Administered 2019-08-23: 100 ug via INTRAVENOUS

## 2019-08-23 MED ORDER — LIDOCAINE HCL (CARDIAC) PF 100 MG/5ML IV SOSY
PREFILLED_SYRINGE | INTRAVENOUS | Status: DC | PRN
  Administered 2019-08-23: 100 mg via INTRAVENOUS

## 2019-08-23 MED ORDER — NITROGLYCERIN IN D5W 200-5 MCG/ML-% IV SOLN
2.0000 ug/min | INTRAVENOUS | Status: DC
  Filled 2019-08-23: qty 250

## 2019-08-23 MED ORDER — CALCIUM CHLORIDE 10 % IV SOLN
INTRAVENOUS | Status: DC | PRN
  Administered 2019-08-23 (×2): 1 g via INTRAVENOUS

## 2019-08-23 MED ORDER — MORPHINE SULFATE (PF) 2 MG/ML IV SOLN
1.0000 mg | INTRAVENOUS | Status: DC | PRN
  Administered 2019-08-23: 2 mg via INTRAVENOUS
  Filled 2019-08-23: qty 1

## 2019-08-23 MED ORDER — DEXMEDETOMIDINE HCL IN NACL 400 MCG/100ML IV SOLN
0.1000 ug/kg/h | INTRAVENOUS | Status: AC
  Administered 2019-08-23: .3 ug/kg/h via INTRAVENOUS
  Filled 2019-08-23: qty 100

## 2019-08-23 MED ORDER — MORPHINE SULFATE (PF) 2 MG/ML IV SOLN
2.0000 mg | Freq: Once | INTRAVENOUS | Status: AC
  Administered 2019-08-23: 2 mg via INTRAVENOUS
  Filled 2019-08-23: qty 1

## 2019-08-23 MED ORDER — SODIUM BICARBONATE 8.4 % IV SOLN
INTRAVENOUS | Status: DC | PRN
  Administered 2019-08-23: 50 meq via INTRAVENOUS

## 2019-08-23 MED ORDER — HYDROCORTISONE NA SUCCINATE PF 100 MG IJ SOLR
INTRAMUSCULAR | Status: DC | PRN
  Administered 2019-08-23 (×2): 125 mg via INTRAVENOUS

## 2019-08-23 MED ORDER — SODIUM BICARBONATE 8.4 % IV SOLN
100.0000 meq | Freq: Once | INTRAVENOUS | Status: AC
  Administered 2019-08-23: 100 meq via INTRAVENOUS

## 2019-08-23 MED ORDER — PLASMA-LYTE 148 IV SOLN
INTRAVENOUS | Status: AC
  Administered 2019-08-23: 500 mL
  Filled 2019-08-23: qty 2.5

## 2019-08-23 MED ORDER — METOPROLOL TARTRATE 12.5 MG HALF TABLET: 12.5000 mg | ORAL_TABLET | Freq: Once | ORAL | Status: DC

## 2019-08-23 MED ORDER — MIDAZOLAM HCL (PF) 10 MG/2ML IJ SOLN
INTRAMUSCULAR | Status: AC
  Filled 2019-08-23: qty 2

## 2019-08-23 MED ORDER — OXYCODONE HCL 5 MG PO TABS
5.0000 mg | ORAL_TABLET | ORAL | Status: DC | PRN
  Administered 2019-08-24: 5 mg via ORAL
  Filled 2019-08-23: qty 1

## 2019-08-23 MED ORDER — CHLORHEXIDINE GLUCONATE 0.12 % MT SOLN
15.0000 mL | OROMUCOSAL | Status: AC
  Administered 2019-08-23: 15 mL via OROMUCOSAL

## 2019-08-23 MED ORDER — SODIUM CHLORIDE 0.9% FLUSH: 3.0000 mL | INTRAVENOUS | Status: DC | PRN

## 2019-08-23 MED ORDER — BISACODYL 5 MG PO TBEC: 5.0000 mg | DELAYED_RELEASE_TABLET | Freq: Once | ORAL | Status: DC

## 2019-08-23 MED ORDER — PANTOPRAZOLE SODIUM 40 MG PO TBEC
40.0000 mg | DELAYED_RELEASE_TABLET | Freq: Every day | ORAL | Status: DC
  Administered 2019-08-25 – 2019-09-01 (×8): 40 mg via ORAL
  Filled 2019-08-23 (×8): qty 1

## 2019-08-23 MED ORDER — PHENYLEPHRINE HCL-NACL 20-0.9 MG/250ML-% IV SOLN
INTRAVENOUS | Status: DC | PRN
  Administered 2019-08-23: 25 ug/min via INTRAVENOUS

## 2019-08-23 MED ORDER — VANCOMYCIN HCL 1000 MG IV SOLR
INTRAVENOUS | Status: DC | PRN
  Administered 2019-08-23: 3000 mg via TOPICAL

## 2019-08-23 MED ORDER — VASOPRESSIN 20 UNIT/ML IV SOLN
INTRAVENOUS | Status: DC | PRN
  Administered 2019-08-23 (×7): 2 [IU] via INTRAVENOUS
  Administered 2019-08-23: 4 [IU] via INTRAVENOUS

## 2019-08-23 MED ORDER — SODIUM CHLORIDE 0.9 % IV SOLN
INTRAVENOUS | Status: DC
  Filled 2019-08-23: qty 30

## 2019-08-23 MED ORDER — TRANEXAMIC ACID (OHS) BOLUS VIA INFUSION
15.0000 mg/kg | INTRAVENOUS | Status: AC
  Administered 2019-08-23: 06:00:00 1197 mg via INTRAVENOUS
  Filled 2019-08-23: qty 1197

## 2019-08-23 MED ORDER — PROTAMINE SULFATE 10 MG/ML IV SOLN
50.0000 mg | Freq: Once | INTRAVENOUS | Status: AC
  Administered 2019-08-23: 50 mg via INTRAVENOUS
  Filled 2019-08-23: qty 5

## 2019-08-23 MED ORDER — HEPARIN SODIUM (PORCINE) 1000 UNIT/ML IJ SOLN
INTRAMUSCULAR | Status: DC | PRN
  Administered 2019-08-23: 23000 [IU] via INTRAVENOUS
  Administered 2019-08-23: 5000 [IU] via INTRAVENOUS

## 2019-08-23 MED ORDER — ALBUMIN HUMAN 5 % IV SOLN
250.0000 mL | INTRAVENOUS | Status: AC | PRN
  Administered 2019-08-23 – 2019-08-24 (×5): 12.5 g via INTRAVENOUS
  Filled 2019-08-23: qty 250
  Filled 2019-08-23: qty 500

## 2019-08-23 MED ORDER — SODIUM CHLORIDE 0.9 % IV BOLUS
1000.0000 mL | Freq: Once | INTRAVENOUS | Status: AC
  Administered 2019-08-23: 1000 mL via INTRAVENOUS

## 2019-08-23 MED ORDER — BISACODYL 5 MG PO TBEC
10.0000 mg | DELAYED_RELEASE_TABLET | Freq: Every day | ORAL | Status: DC
  Administered 2019-08-24 – 2019-08-31 (×7): 10 mg via ORAL
  Filled 2019-08-23 (×8): qty 2

## 2019-08-23 MED ORDER — DOCUSATE SODIUM 100 MG PO CAPS
200.0000 mg | ORAL_CAPSULE | Freq: Every day | ORAL | Status: DC
  Administered 2019-08-24 – 2019-08-31 (×7): 200 mg via ORAL
  Filled 2019-08-23 (×9): qty 2

## 2019-08-23 MED ORDER — HEMOSTATIC AGENTS (NO CHARGE) OPTIME
TOPICAL | Status: DC | PRN
  Administered 2019-08-23 (×5): 1 via TOPICAL

## 2019-08-23 MED ORDER — SODIUM CHLORIDE (PF) 0.9 % IJ SOLN
OROMUCOSAL | Status: DC | PRN
  Administered 2019-08-23 (×3): 4 mL via TOPICAL

## 2019-08-23 MED ORDER — TRANEXAMIC ACID 1000 MG/10ML IV SOLN
1.5000 mg/kg/h | INTRAVENOUS | Status: AC
  Administered 2019-08-23: 1.5 mg/kg/h via INTRAVENOUS
  Filled 2019-08-23: qty 25

## 2019-08-23 MED ORDER — METOPROLOL TARTRATE 5 MG/5ML IV SOLN
2.5000 mg | INTRAVENOUS | Status: DC | PRN
  Administered 2019-08-31: 5 mg via INTRAVENOUS
  Administered 2019-08-31: 2.5 mg via INTRAVENOUS
  Filled 2019-08-23 (×2): qty 5

## 2019-08-23 MED ORDER — ACETAMINOPHEN 650 MG RE SUPP
650.0000 mg | Freq: Once | RECTAL | Status: AC
  Administered 2019-08-23: 650 mg via RECTAL

## 2019-08-23 MED ORDER — FAMOTIDINE IN NACL 20-0.9 MG/50ML-% IV SOLN
20.0000 mg | Freq: Two times a day (BID) | INTRAVENOUS | Status: DC
  Administered 2019-08-23: 20 mg via INTRAVENOUS

## 2019-08-23 MED ORDER — DEXTROSE 50 % IV SOLN: 0.0000 mL | INTRAVENOUS | Status: DC | PRN

## 2019-08-23 MED ORDER — PROTAMINE SULFATE 10 MG/ML IV SOLN
INTRAVENOUS | Status: DC | PRN
  Administered 2019-08-23: 10 mg via INTRAVENOUS
  Administered 2019-08-23: 270 mg via INTRAVENOUS
  Administered 2019-08-23: 50 mg via INTRAVENOUS

## 2019-08-23 SURGICAL SUPPLY — 109 items
ADAPTER CARDIO PERF ANTE/RETRO (ADAPTER) ×3 IMPLANT
BAG DECANTER FOR FLEXI CONT (MISCELLANEOUS) ×3 IMPLANT
BLADE CLIPPER SURG (BLADE) ×3 IMPLANT
BLADE STERNUM SYSTEM 6 (BLADE) ×3 IMPLANT
BLADE SURG 11 STRL SS (BLADE) IMPLANT
BLADE SURG 15 STRL LF DISP TIS (BLADE) IMPLANT
BLADE SURG 15 STRL SS (BLADE)
CANISTER SUCT 3000ML PPV (MISCELLANEOUS) ×3 IMPLANT
CANN PRFSN .5XCNCT 15X34-48 (MISCELLANEOUS)
CANN PRFSN 3/8XCNCT ST RT ANG (MISCELLANEOUS) ×2
CANN PRFSN 3/8XRT ANG TPR 14 (MISCELLANEOUS) ×2
CANNULA GUNDRY RCSP 15FR (MISCELLANEOUS) ×3 IMPLANT
CANNULA NON VENT 18FR 12 (CANNULA) ×3 IMPLANT
CANNULA PRFSN .5XCNCT 15X34-48 (MISCELLANEOUS) IMPLANT
CANNULA PRFSN 3/8XCNCT RT ANG (MISCELLANEOUS) ×2 IMPLANT
CANNULA PRFSN 3/8XRT ANG TPR14 (MISCELLANEOUS) ×2 IMPLANT
CANNULA SUMP PERICARDIAL (CANNULA) ×3 IMPLANT
CANNULA VEN 2 STAGE (MISCELLANEOUS)
CANNULA VEN MTL TIP RT (MISCELLANEOUS) ×2
CANNULA VENOUS DUAL 34/46 (CANNULA) ×3 IMPLANT
CATH HEART VENT LEFT (CATHETERS) ×2 IMPLANT
CATH RETROPLEGIA CORONARY 14FR (CATHETERS) ×3 IMPLANT
CATH ROBINSON RED A/P 18FR (CATHETERS) ×3 IMPLANT
CATH THORACIC 28FR (CATHETERS) IMPLANT
CATH THORACIC 36FR RT ANG (CATHETERS) IMPLANT
CLIP VESOCCLUDE LG 6/CT (CLIP) ×3 IMPLANT
CLIP VESOCCLUDE MED 6/CT (CLIP) IMPLANT
CLIP VESOCCLUDE SM WIDE 24/CT (CLIP) IMPLANT
CLIP VESOCCLUDE SM WIDE 6/CT (CLIP) IMPLANT
CONN 1/2X1/2X1/2  BEN (MISCELLANEOUS) ×1
CONN 1/2X1/2X1/2 BEN (MISCELLANEOUS) ×2 IMPLANT
CONN 3/8X1/2 ST GISH (MISCELLANEOUS) ×6 IMPLANT
CONN 3/8X3/8 GISH STERILE (MISCELLANEOUS) IMPLANT
CONN ST 1/4X3/8  BEN (MISCELLANEOUS) ×1
CONN ST 1/4X3/8 BEN (MISCELLANEOUS) ×2 IMPLANT
CONN Y 3/8X3/8X3/8  BEN (MISCELLANEOUS)
CONN Y 3/8X3/8X3/8 BEN (MISCELLANEOUS) IMPLANT
DERMABOND ADVANCED (GAUZE/BANDAGES/DRESSINGS) ×2
DERMABOND ADVANCED .7 DNX12 (GAUZE/BANDAGES/DRESSINGS) ×4 IMPLANT
DRSG AQUACEL AG ADV 3.5X14 (GAUZE/BANDAGES/DRESSINGS) ×3 IMPLANT
ELECT REM PT RETURN 9FT ADLT (ELECTROSURGICAL) ×6
ELECTRODE REM PT RTRN 9FT ADLT (ELECTROSURGICAL) ×4 IMPLANT
FELT TEFLON 6X6 (MISCELLANEOUS) ×3 IMPLANT
GAUZE SPONGE 4X4 12PLY STRL (GAUZE/BANDAGES/DRESSINGS) ×6 IMPLANT
GLOVE BIO SURGEON STRL SZ 6 (GLOVE) IMPLANT
GLOVE BIO SURGEON STRL SZ 6.5 (GLOVE) IMPLANT
GLOVE BIO SURGEON STRL SZ7 (GLOVE) IMPLANT
GLOVE BIO SURGEON STRL SZ7.5 (GLOVE) IMPLANT
GOWN STRL REUS W/ TWL LRG LVL3 (GOWN DISPOSABLE) ×8 IMPLANT
GOWN STRL REUS W/TWL LRG LVL3 (GOWN DISPOSABLE) ×4
GRAFT HEMASHIELD 10MM (Graft) ×1 IMPLANT
GRAFT VASC STRG 30X10STRL (Graft) ×2 IMPLANT
GRAFT WOVEN D/V 30DX30L (Vascular Products) ×3 IMPLANT
HEMOSTAT POWDER SURGIFOAM 1G (HEMOSTASIS) ×9 IMPLANT
HEMOSTAT SURGICEL 2X4 FIBR (HEMOSTASIS) ×6 IMPLANT
INSERT FOGARTY SM (MISCELLANEOUS) ×3 IMPLANT
INSERT FOGARTY XLG (MISCELLANEOUS) ×6 IMPLANT
KIT BASIN OR (CUSTOM PROCEDURE TRAY) ×3 IMPLANT
KIT DRAINAGE VACCUM ASSIST (KITS) ×3 IMPLANT
KIT SUCTION CATH 14FR (SUCTIONS) ×3 IMPLANT
KIT TURNOVER KIT B (KITS) ×3 IMPLANT
LEAD PACING MYOCARDI (MISCELLANEOUS) ×3 IMPLANT
LINE VENT (MISCELLANEOUS) ×3 IMPLANT
LOOP VESSEL SUPERMAXI WHITE (MISCELLANEOUS) IMPLANT
NEEDLE AORTIC AIR ASPIRATING (NEEDLE) IMPLANT
NS IRRIG 1000ML POUR BTL (IV SOLUTION) ×15 IMPLANT
PACK OPEN HEART (CUSTOM PROCEDURE TRAY) ×3 IMPLANT
PAD ARMBOARD 7.5X6 YLW CONV (MISCELLANEOUS) ×6 IMPLANT
POSITIONER HEAD DONUT 9IN (MISCELLANEOUS) IMPLANT
POWDER SURGICEL 3.0 GRAM (HEMOSTASIS) ×6 IMPLANT
SEALANT SURG COSEAL 8ML (VASCULAR PRODUCTS) ×3 IMPLANT
SET CARDIOPLEGIA MPS 5001102 (MISCELLANEOUS) ×3 IMPLANT
SET VEIN GRAFT PERF (SET/KITS/TRAYS/PACK) ×3 IMPLANT
SPONGE LAP 18X18 RF (DISPOSABLE) ×3 IMPLANT
SPONGE LAP 4X18 RFD (DISPOSABLE) ×9 IMPLANT
STAPLER VISISTAT 35W (STAPLE) ×3 IMPLANT
STOPCOCK 4 WAY LG BORE MALE ST (IV SETS) IMPLANT
SUT MNCRL AB 3-0 PS2 18 (SUTURE) ×6 IMPLANT
SUT PDS AB 1 CTX 36 (SUTURE) ×6 IMPLANT
SUT PROLENE 3 0 RB 1 (SUTURE) IMPLANT
SUT PROLENE 3 0 SH 48 (SUTURE) ×9 IMPLANT
SUT PROLENE 3 0 SH DA (SUTURE) ×51 IMPLANT
SUT PROLENE 4 0 RB 1 (SUTURE) ×6
SUT PROLENE 4 0 SH DA (SUTURE) ×6 IMPLANT
SUT PROLENE 4-0 RB1 .5 CRCL 36 (SUTURE) ×12 IMPLANT
SUT STEEL STERNAL CCS#1 18IN (SUTURE) IMPLANT
SUT STEEL SZ 6 DBL 3X14 BALL (SUTURE) IMPLANT
SUT VIC AB 1 CT1 18XCR BRD 8 (SUTURE) IMPLANT
SUT VIC AB 1 CT1 8-18 (SUTURE)
SUT VIC AB 1 CTX 27 (SUTURE) IMPLANT
SUT VIC AB 2-0 CT1 27 (SUTURE) ×1
SUT VIC AB 2-0 CT1 TAPERPNT 27 (SUTURE) ×2 IMPLANT
SUT VIC AB 2-0 CTX 36 (SUTURE) IMPLANT
SUT VIC AB 3-0 SH 27 (SUTURE)
SUT VIC AB 3-0 SH 27X BRD (SUTURE) IMPLANT
SUT VIC AB 3-0 X1 27 (SUTURE) IMPLANT
SUT VICRYL 4-0 PS2 18IN ABS (SUTURE) IMPLANT
SYR 10ML KIT SKIN ADHESIVE (MISCELLANEOUS) ×3 IMPLANT
SYSTEM SAHARA CHEST DRAIN ATS (WOUND CARE) ×3 IMPLANT
TAPE CLOTH SOFT 2X10 (GAUZE/BANDAGES/DRESSINGS) ×3 IMPLANT
TAPE CLOTH SURG 4X10 WHT LF (GAUZE/BANDAGES/DRESSINGS) ×6 IMPLANT
TOWEL GREEN STERILE (TOWEL DISPOSABLE) ×3 IMPLANT
TOWEL GREEN STERILE FF (TOWEL DISPOSABLE) ×3 IMPLANT
TRAY CATH LUMEN 1 20CM STRL (SET/KITS/TRAYS/PACK) IMPLANT
TUBE CONNECTING 12X1/4 (SUCTIONS) ×3 IMPLANT
TUBE SUCT INTRACARD DLP 20F (MISCELLANEOUS) ×3 IMPLANT
VENT LEFT HEART 12002 (CATHETERS) ×3
WATER STERILE IRR 1000ML POUR (IV SOLUTION) ×6 IMPLANT
YANKAUER SUCT BULB TIP NO VENT (SUCTIONS) ×3 IMPLANT

## 2019-08-23 NOTE — Transfer of Care (Signed)
Immediate Anesthesia Transfer of Care Note  Patient: Henry Hayden  Procedure(s) Performed: REPAIR OF TYPE A AORTIC DISSECTION USING HEMASHIELF PLATINUM 30MM VASCULAR GRAFT (N/A ) Mitral Valve Repair (Mvr)  Patient Location: SICU  Anesthesia Type:General  Level of Consciousness: Patient remains intubated per anesthesia plan  Airway & Oxygen Therapy: Patient remains intubated per anesthesia plan  Post-op Assessment: Report given to RN and Post -op Vital signs reviewed and stable  Post vital signs: Reviewed and stable  Last Vitals:  Vitals Value Taken Time  BP 107/70 08/23/19 1330  Temp    Pulse 89 08/23/19 1330  Resp 18 08/23/19 1330  SpO2 99 % 08/23/19 1330  Vitals shown include unvalidated device data.  Last Pain:  Vitals:   08/23/19 0344  TempSrc: Oral  PainSc: 1          Complications: No apparent anesthesia complications

## 2019-08-23 NOTE — Progress Notes (Signed)
ANTICOAGULATION CONSULT NOTE - Initial Consult  Pharmacy Consult for heparin Indication: chest pain/ACS  No Known Allergies  Patient Measurements: Height: 6' (182.9 cm) Weight: 176 lb (79.8 kg) IBW/kg (Calculated) : 77.6 Heparin Dosing Weight: 79.8 kg  Vital Signs: Temp: 97.5 F (36.4 C) (02/06 2349) Temp Source: Oral (02/06 2349) BP: 122/82 (02/07 0020) Pulse Rate: 56 (02/07 0020)  Labs: Recent Labs    08/22/19 2345  HGB 14.2  HCT 43.4  PLT 171  APTT 27  LABPROT 12.9  INR 1.0  CREATININE 1.18  TROPONINIHS 13    Estimated Creatinine Clearance: 64.8 mL/min (by C-G formula based on SCr of 1.18 mg/dL).   Medical History: History reviewed. No pertinent past medical history.   Assessment: Henry Hayden is a 69 y.o. male who presented with acute onset of chest pain,  SOB, and diaphoresis. Patient does not have any PTA prescription medications. EKG concerning, pharmacy consulted to start heparin. CBC and coags wnl except for D-dimer of 1.86  Of note: Per Pt he previously tested positive for COVID-19 at [place] on Dec. 30th, 2020  Goal of Therapy:  Heparin level 0.3-0.7 units/ml Monitor platelets by anticoagulation protocol: Yes   Plan:  Give 4000 units bolus x 1 Start heparin infusion at 950 units/hr Check anti-Xa level in 6-8 hours and daily while on heparin Continue to monitor H&H and platelets   Thank you for allowing [place] to participate in this patients care. Jens Som, PharmD 08/23/2019,12:48 AM

## 2019-08-23 NOTE — Procedures (Signed)
Extubation Procedure Note  Patient Details:   Name: Henry Hayden DOB: [birth year] MRN: [id number]   Airway Documentation:    Vent end date: 08/23/19 Vent end time: 2050   Evaluation  O2 sats: stable throughout Complications: No apparent complications Patient did tolerate procedure well. Bilateral Breath Sounds: Clear, Diminished   Yes   Patient extubated to 4L  with humidity. Patient able to speak. No stridor noted.   VC: 1.2 L NIF: -10  [name] 08/23/2019, 8:54 PM

## 2019-08-23 NOTE — Anesthesia Procedure Notes (Signed)
Arterial Line Insertion Start/End2/01/2020 5:35 AM, 08/23/2019 5:36 AM Performed by: Jearld Pies, CRNA, CRNA  Patient location: OR. Preanesthetic checklist: patient identified, IV checked, site marked, risks and benefits discussed, surgical consent, monitors and equipment checked, pre-op evaluation, timeout performed and anesthesia consent Patient sedated Right was placed Catheter size: 20 G Hand hygiene performed , maximum sterile barriers used  and Seldinger technique used Allen's test indicative of satisfactory collateral circulation Attempts: 1 Procedure performed without using ultrasound guided technique. Following insertion, dressing applied and Biopatch. Post procedure assessment: normal  Patient tolerated the procedure well with no immediate complications.

## 2019-08-23 NOTE — Progress Notes (Signed)
Spoke with Angie in Infection Prevention, patient recovered from [place] in December, still with +PCR, OK to be in regular room. [place], Ardeth Sportsman

## 2019-08-23 NOTE — Brief Op Note (Addendum)
08/22/2019 - 08/23/2019  12:25 PM  PATIENT:  Ardine Eng  69 y.o. male  PRE-OPERATIVE DIAGNOSIS:  TYPE A AORTIC DISSECTION MITRAL REGURGITATION   POST-OPERATIVE DIAGNOSIS:   -TYPE A AORTIC DISSECTION -MITRAL REGURGITATION  PROCEDURE:  Procedure(s): -REPAIR OF TYPE A AORTIC DISSECTION USING HEMASHIELD PLATINUM 30MM VASCULAR GRAFT  -MITRAL VALVE REPAIR  SURGEON:  Wonda Olds, MD   PHYSICIAN ASSISTANT: Roddenberry  ANESTHESIA:   general  EBL:  Per anesthesia and perfusion records   BLOOD ADMINISTERED:  Plts, FFP.Cryo  DRAINS: Mediastinal drains  LOCAL MEDICATIONS USED:  NONE  SPECIMEN:  Source of Specimen:  Segment of dissected ascending aorta  DISPOSITION OF SPECIMEN:  PATHOLOGY  COUNTS:  YES  DICTATION: .Dragon Dictation  PLAN OF CARE: Admit to inpatient   PATIENT DISPOSITION:  ICU - intubated and hemodynamically stable.   Delay start of Pharmacological VTE agent (>24hrs) due to surgical blood loss or risk of bleeding: yes   Agree with BON. Lakira Ogando Z. Orvan Seen, Bunk Foss

## 2019-08-23 NOTE — Anesthesia Procedure Notes (Signed)
Central Venous Catheter Insertion Performed by: Lillia Abed, MD, anesthesiologist Patient location: Pre-op. Preanesthetic checklist: patient identified, IV checked, site marked, risks and benefits discussed, surgical consent, monitors and equipment checked, pre-op evaluation, timeout performed and anesthesia consent Position: Trendelenburg Lidocaine 1% used for infiltration and patient sedated Hand hygiene performed  and maximum sterile barriers used  Catheter size: 8.5 Fr Central line and PA cath was placed.Sheath introducer Swan type:thermodilution Procedure performed using ultrasound guided technique. Ultrasound Notes:anatomy identified, needle tip was noted to be adjacent to the nerve/plexus identified, no ultrasound evidence of intravascular and/or intraneural injection and image(s) printed for medical record Attempts: 1 Following insertion, line sutured and dressing applied. Post procedure assessment: blood return through all ports, free fluid flow and no air  Patient tolerated the procedure well with no immediate complications.

## 2019-08-23 NOTE — ED Notes (Signed)
Date and time results received: 08/23/19 0115  Test: COVID Critical Value: positive  Name of Provider Notified: Tomi Bamberger, MD  Orders Received? Or Actions Taken?: acknowledged

## 2019-08-23 NOTE — ED Notes (Signed)
Pt transported with [facility], and Lonn Georgia, RN.

## 2019-08-23 NOTE — ED Notes (Signed)
Pts wife notifies staff that she also gave pt 324mg  of chewable ASA prior to arrival. Pharmacy notified.

## 2019-08-23 NOTE — Anesthesia Postprocedure Evaluation (Signed)
Anesthesia Post Note  Patient: Social research officer, government  Procedure(s) Performed: REPAIR OF TYPE A AORTIC DISSECTION USING HEMASHIELF PLATINUM 30MM VASCULAR GRAFT (N/A ) Mitral Valve Repair (Mvr)     Patient location during evaluation: SICU Anesthesia Type: General Level of consciousness: patient remains intubated per anesthesia plan Vital Signs Assessment: post-procedure vital signs reviewed and stable Respiratory status: patient remains intubated per anesthesia plan Postop Assessment: no apparent nausea or vomiting Anesthetic complications: no    Last Vitals:  Vitals:   08/23/19 1600 08/23/19 1615  BP: 121/82 (!) 85/54  Pulse: 72 81  Resp: 18 18  Temp: (!) 34.6 C (!) 34.5 C  SpO2: 100%     Last Pain:  Vitals:   08/23/19 1600  TempSrc: Bladder  PainSc:                  Henry Hayden

## 2019-08-23 NOTE — Anesthesia Procedure Notes (Signed)
Arterial Line Insertion Start/End2/01/2020 5:25 AM, 08/23/2019 5:25 AM Performed by: Suzy Bouchard, CRNA, CRNA  Preanesthetic checklist: patient identified, IV checked, site marked, risks and benefits discussed, surgical consent, monitors and equipment checked, pre-op evaluation and timeout performed Emergency situation Patient sedated radial was placed Catheter size: 20 G Hand hygiene performed  and maximum sterile barriers used   Attempts: 1 Procedure performed without using ultrasound guided technique. Following insertion, dressing applied and Biopatch. Post procedure assessment: normal  Patient tolerated the procedure well with no immediate complications.

## 2019-08-23 NOTE — Progress Notes (Signed)
Echocardiogram 2D Echocardiogram has been performed.  Oneal Deputy Nimrod Wendt 08/23/2019, 7:33 AM

## 2019-08-23 NOTE — Anesthesia Preprocedure Evaluation (Addendum)
Anesthesia Evaluation  Patient identified by MRN, date of birth, ID band Patient awake    Reviewed: Allergy & Precautions, NPO status , Patient's Chart, lab work & pertinent test results  Airway Mallampati: I  TM Distance: >3 FB Neck ROM: Full    Dental   Pulmonary    Pulmonary exam normal        Cardiovascular + angina Normal cardiovascular exam  Type A Aortic Disection for repair.   Neuro/Psych    GI/Hepatic   Endo/Other    Renal/GU      Musculoskeletal   Abdominal   Peds  Hematology   Anesthesia Other Findings   Reproductive/Obstetrics                            Anesthesia Physical Anesthesia Plan  ASA: IV and emergent  Anesthesia Plan: General   Post-op Pain Management:    Induction: Intravenous, Rapid sequence and Cricoid pressure planned  PONV Risk Score and Plan: 2 and Treatment may vary due to age or medical condition  Airway Management Planned: Oral ETT  Additional Equipment: Arterial line, PA Cath, TEE and Ultrasound Guidance Line Placement  Intra-op Plan:   Post-operative Plan: Post-operative intubation/ventilation  Informed Consent: I have reviewed the patients History and Physical, chart, labs and discussed the procedure including the risks, benefits and alternatives for the proposed anesthesia with the patient or authorized representative who has indicated his/her understanding and acceptance.       Plan Discussed with: CRNA and Surgeon  Anesthesia Plan Comments:         Anesthesia Quick Evaluation

## 2019-08-23 NOTE — H&P (Signed)
[address]       [place] [address]             [phone]        Mehki Smithey Erwin Medical Record [id number] Date of Birth: 1950/08/05  Referring: No ref. provider found Primary Care: Rory Percy, MD Primary Cardiologist:No primary care provider on file.  Chief Complaint:    Chief Complaint  Patient presents with  . Chest Pain    History of Present Illness:      69 yo man in [place] until evening of 2/6 when he developed sudden onset CP which was associated with diaphoresis, SOB, and nausea. He had never experienced such before. Due to the severity of the pain, he presented to ED. Work-up at [facility] demonstrated Type A Aortic Dissection. Hemodynamically stable and pain resolved with NTG. Txferred to [facility] for repair, remains stable and now sx-free. Importantly, he was COVID-19 positive in late Dec 2020, was treated with abx and steroids, and has been sx free for several weeks, yet his COVID testing remains positive. He is not considered infective.  Current Activity/ Functional Status: Patient will be independent with mobility/ambulation, transfers, ADL's, IADL's.   Zubrod Score: At the time of surgery this patient's most appropriate activity status/level should be described as: []     0    Normal activity, no symptoms []     1    Restricted in physical strenuous activity but ambulatory, able to do out light work []     2    Ambulatory and capable of self care, unable to do work activities, up and about                 more than 50%  Of the time                            []     3    Only limited self care, in bed greater than 50% of waking hours []     4    Completely disabled, no self care, confined to bed or chair []     5    Moribund  History reviewed. No pertinent past medical history.  History reviewed. No pertinent surgical history.  Social History   Tobacco Use  Smoking Status Never Smoker  Smokeless Tobacco Current User  . Types: Chew      Social History   Substance and Sexual Activity  Alcohol Use Not Currently     No Known Allergies  Current Facility-Administered Medications  Medication Dose Route Frequency Provider Last Rate Last Admin  . 0.9 %  sodium chloride infusion   Intravenous Continuous Rolland Porter, MD      . acetaminophen (TYLENOL) tablet 650 mg  650 mg Oral Q4H PRN Stepheny Canal, Glenice Bow, MD      . bisacodyl (DULCOLAX) EC tablet 5 mg  5 mg Oral Once Wonda Olds, MD      . Derrill Memo ON 08/24/2019] chlorhexidine (PERIDEX) 0.12 % solution 15 mL  15 mL Mouth/Throat Once Charle Clear, Glenice Bow, MD      . Chlorhexidine Gluconate Cloth 2 % PADS 6 each  6 each Topical Once Wonda Olds, MD       And  . Chlorhexidine Gluconate Cloth 2 % PADS 6 each  6 each Topical Once Suzann Lazaro Z, MD      . dexmedetomidine (PRECEDEX) 400 MCG/100ML (4 mcg/mL) infusion  0.1-0.7  mcg/kg/hr Intravenous To OR Alik Mawson, Glenice Bow, MD      . DOPamine (INTROPIN) 800 mg in dextrose 5 % 250 mL (3.2 mg/mL) infusion  0-10 mcg/kg/min Intravenous To OR Jezabelle Chisolm, Kerri Kovacik Z, MD      . EPINEPHrine (ADRENALIN) 4 mg in dextrose 5 % 250 mL (0.016 mg/mL) infusion  0-10 mcg/min Intravenous To OR Ingri Diemer Z, MD      . heparin 2,500 Units, papaverine 30 mg in electrolyte-148 (PLASMALYTE-148) 500 mL irrigation   Irrigation To OR Ennis Delpozo, Leahmarie Gasiorowski Z, MD      . heparin 30,000 units/NS 1000 mL solution for CELLSAVER   Other To OR Artisha Capri, Glenice Bow, MD      . insulin regular, human (MYXREDLIN) 100 units/ 100 mL infusion   Intravenous To OR Lavontae Cornia, Glenice Bow, MD      . Burgess Amor Blood Cardioplegia vial (lidocaine/magnesium/mannitol 0.26g-4g-6.4g)   Intracoronary Once Wonda Olds, MD      . Derrill Memo ON 08/24/2019] metoprolol tartrate (LOPRESSOR) tablet 12.5 mg  12.5 mg Oral Once Delvonte Berenson, Glenice Bow, MD      . milrinone (PRIMACOR) 20 MG/100 ML (0.2 mg/mL) infusion  0.3 mcg/kg/min Intravenous To OR Govanni Plemons Z, MD      . nitroGLYCERIN 50 mg in dextrose 5 %  250 mL (0.2 mg/mL) infusion  5-200 mcg/min Intravenous Continuous Tomi Bamberger, Iva, MD 7.5 mL/hr at 08/23/19 0300 25 mcg/min at 08/23/19 0300  . nitroGLYCERIN 50 mg in dextrose 5 % 250 mL (0.2 mg/mL) infusion  2-200 mcg/min Intravenous To OR Trudie Cervantes, Glenice Bow, MD      . ondansetron (ZOFRAN) injection 4 mg  4 mg Intravenous Q6H PRN [place], Glenice Bow, MD      . pantoprazole (PROTONIX) injection 40 mg  40 mg Intravenous QHS Raidon Swanner Z, MD      . phenylephrine (NEOSYNEPHRINE) 20-0.9 MG/250ML-% infusion  30-200 mcg/min Intravenous To OR Nieves Chapa Z, MD      . potassium chloride injection 80 mEq  80 mEq Other To OR Carmon Brigandi, Glenice Bow, MD      . temazepam (RESTORIL) capsule 15 mg  15 mg Oral Once PRN Wise Fees, Glenice Bow, MD      . tranexamic acid (CYKLOKAPRON) 2,500 mg in sodium chloride 0.9 % 250 mL (10 mg/mL) infusion  1.5 mg/kg/hr Intravenous To OR Arnecia Ector, Glenice Bow, MD      . tranexamic acid (CYKLOKAPRON) bolus via infusion - over 30 minutes 1,197 mg  15 mg/kg Intravenous To OR Kristene Liberati Z, MD      . tranexamic acid (CYKLOKAPRON) pump prime solution 160 mg  2 mg/kg Intracatheter To OR Taniah Reinecke, Glenice Bow, MD        No medications prior to admission.    No family history on file.   Review of Systems:   ROS A comprehensive review of systems was negative.     Cardiac Review of Systems: Y or  [    ]= no  Chest Pain [    ]  Resting SOB [   ] Exertional SOB  [  ]  Orthopnea [  ]   Pedal Edema [   ]    Palpitations [  ] Syncope  [  ]   Presyncope [   ]  General Review of Systems: [Y] = yes [  ]=no Constitional: recent weight change [  ]; anorexia [  ]; fatigue [  ]; nausea [  ]; night sweats [  ]; fever [  ];  or chills [  ]                                                               Dental: Last Dentist visit:   Eye : blurred vision [  ]; diplopia [   ]; vision changes [  ];  Amaurosis fugax[  ]; Resp: cough [  ];  wheezing[  ];  hemoptysis[  ]; shortness of breath[  ]; paroxysmal  nocturnal dyspnea[  ]; dyspnea on exertion[  ]; or orthopnea[  ];  GI:  gallstones[  ], vomiting[  ];  dysphagia[  ]; melena[  ];  hematochezia [  ]; heartburn[  ];   Hx of  Colonoscopy[  ]; GU: kidney stones [  ]; hematuria[  ];   dysuria [  ];  nocturia[  ];  history of     obstruction [  ]; urinary frequency [  ]             Skin: rash, swelling[  ];, hair loss[  ];  peripheral edema[  ];  or itching[  ]; Musculosketetal: myalgias[  ];  joint swelling[  ];  joint erythema[  ];  joint pain[  ];  back pain[  ];  Heme/Lymph: bruising[  ];  bleeding[  ];  anemia[  ];  Neuro: TIA[  ];  headaches[  ];  stroke[  ];  vertigo[  ];  seizures[  ];   paresthesias[  ];  difficulty walking[  ];  Psych:depression[  ]; anxiety[  ];  Endocrine: diabetes[  ];  thyroid dysfunction[  ];          Physical Exam: BP 114/72   Pulse 70   Temp 98.6 F (37 C) (Oral)   Resp (!) 23   Ht 6' (1.829 m)   Wt 79.8 kg   SpO2 95%   BMI 23.87 kg/m    General appearance: alert, cooperative and no distress Head: Normocephalic, without obvious abnormality, atraumatic Neck: no adenopathy, no carotid bruit, no JVD, supple, symmetrical, trachea midline and thyroid not enlarged, symmetric, no tenderness/mass/nodules Resp: clear to auscultation bilaterally Cardio: regular rate and rhythm and systolic murmur: systolic ejection 3/6, low pitch at apex GI: soft, non-tender; bowel sounds normal; no masses,  no organomegaly Extremities: extremities normal, atraumatic, no cyanosis or edema Neurologic: Alert and oriented X 3, normal strength and tone. Normal symmetric reflexes. Normal coordination and gait  Diagnostic Studies & Laboratory data:     Recent Radiology Findings:   CT Angio Chest PE W/Cm &/Or Wo Cm  Result Date: 08/23/2019 CLINICAL DATA:  Chest pain. Positive D-dimer. COVID positive in December. EXAM: CT ANGIOGRAPHY CHEST WITH CONTRAST TECHNIQUE: Multidetector CT imaging of the chest was performed using the standard  protocol during bolus administration of intravenous contrast. Multiplanar CT image reconstructions and MIPs were obtained to evaluate the vascular anatomy. CONTRAST:  171mL OMNIPAQUE IOHEXOL 350 MG/ML SOLN COMPARISON:  None. FINDINGS: Cardiovascular: Contrast injection is sufficient to demonstrate satisfactory opacification of the pulmonary arteries to the segmental level.The main pulmonary artery is not significantly dilated. 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 The dissection begins approximately 2.5 cm superior to the aortic valve. The dissection is suboptimally evaluated secondary to contrast bolus timing. The arch vessels appear  to be grossly patent, however extension of the dissection flap into these vessels is difficult to entirely exclude. The ascending aorta measures approximately 4.5 cm in diameter and is aneurysmal at this diameter. The dissection appears to terminate at the level of the mid descending thoracic aorta. The dissection does not appear to extend into the abdominal aorta. The heart size is normal. There is no significant pericardial effusion. Advanced coronary artery calcifications are noted. There is some mild reflux of contrast in the IVC. There is significant right atrial enlargement. Mediastinum/Nodes: --No mediastinal or hilar lymphadenopathy. --No axillary lymphadenopathy. --No supraclavicular lymphadenopathy. --Normal thyroid gland. --The esophagus is unremarkable Lungs/Pleura: There are pulmonary opacities in the right lower lobe. There are few subtle ground-glass airspace opacities in the left lower lobe. There is no pneumothorax or large pleural effusion. The trachea is unremarkable. Upper Abdomen: There is a nonobstructing stone in the upper pole the right kidney. There is some narrowing at the origin of the celiac axis of doubtful clinical significance. Musculoskeletal: No chest wall abnormality. No acute or significant osseous findings. Review of the MIP  images confirms the above findings. IMPRESSION: 1. No acute pulmonary embolism. 2. Acute appearing Stanford type A dissection as detailed above. This dissection is suboptimally evaluated on this exam secondary to contrast timing. The arch vessels appear to be grossly patent, but are suboptimally evaluated. 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 3. Subtle reflux of contrast in the IVC suggestive right-sided heart failure. 4. Significant right atrial enlargement is noted. 5. Mild cardiomegaly with coronary artery disease. 6. Bibasilar airspace opacities, right worse than left may represent residual infiltrates in the setting of a prior COVID-19 diagnosis. 7. Nonobstructing stone in the upper pole the right kidney. These results were called by telephone at the time of interpretation on 08/23/2019 at 2:03 am to provider IVA KNAPP , who verbally acknowledged these results. Electronically Signed   By: Constance Holster M.D.   On: 08/23/2019 02:07   DG Chest Port 1 View  Result Date: 08/23/2019 CLINICAL DATA:  Central chest pain COVID positive EXAM: PORTABLE CHEST 1 VIEW COMPARISON:  None. FINDINGS: The heart size and mediastinal contours are within normal limits. There is prominence of the central pulmonary vasculature. The visualized skeletal structures are unremarkable. IMPRESSION: Pulmonary vascular congestion. Electronically Signed   By: Prudencio Pair M.D.   On: 08/23/2019 00:23     I have independently reviewed the above radiologic studies and discussed with the patient   Recent Lab Findings: Lab Results  Component Value Date   WBC 7.8 08/22/2019   HGB 14.2 08/22/2019   HCT 43.4 08/22/2019   PLT 171 08/22/2019   GLUCOSE 130 (H) 08/22/2019   CHOL 155 08/22/2019   TRIG 110 08/22/2019   HDL 47 08/22/2019   LDLCALC 86 08/22/2019   ALT 24 08/22/2019   AST 34 08/22/2019   NA 135 08/22/2019   K 4.0 08/22/2019   CL 101 08/22/2019   CREATININE 1.18 08/22/2019   BUN 14  08/22/2019   CO2 27 08/22/2019   INR 1.0 08/22/2019      Assessment / Plan:      Type A Aortic dissection by CT and confirmed by physical exam finding of aortic insufficiency murmur. To OR for urgent repair.     I  spent 30 minutes counseling the patient face to face.   Herson Prichard Z. Orvan Seen, MD [phone] 08/23/2019 4:09 AM

## 2019-08-23 NOTE — Progress Notes (Addendum)
NAME:  Henry Hayden, MRN:  [id number], DOB:  January 05, 1951, LOS: 0 ADMISSION DATE:  08/22/2019, CONSULTATION DATE:  08/23/2019  REFERRING MD: Christiana Fuchs, CHIEF COMPLAINT: Postoperative bleeding  HPI/course in hospital  69 year old previously healthy man who underwent  No prior history of hypertension.  Developed acute retrosternal chest pain without radiation with no nausea or vomiting.  Generalized weakness.  Brought to ED and found to have type a dissection sparing the valve.  2/7 underwent repair of type a aortic dissection with hemiarch repair and repair of mitral valve for mitral regurgitation.  20 minutes circulatory arrest. CBP 2h39min. Able to come off pump easily, normal LV by echo.  Volume responsive.  Received 3200 mL of fluid in addition to 2300 mL of Cell Saver.  Unable to obtain coagulation studies intraoperatively reversed empirically with FFP cryoprecipitate, tranexamic acid protamine.  Past Medical History  History reviewed. No pertinent past medical history.  History reviewed. No pertinent surgical history.   Review of Systems:   Review of Systems  Unable to perform ROS: Critical illness    Social History   reports that he has never smoked. His smokeless tobacco use includes chew. He reports previous alcohol use. He reports that he does not use drugs.   Family History   His family history is not on file.   Allergies No Known Allergies   Home Medications  Prior to Admission medications   Not on File     Interim history/subjective:  350 mL out from chest tube in first hour.  Labile blood pressure.  Objective   Blood pressure 115/78, pulse 76, temperature 98.6 F (37 C), temperature source Oral, resp. rate 15, height 6' (1.829 m), weight 80.4 kg, SpO2 94 %.        Intake/Output Summary (Last 24 hours) at 08/23/2019 1422 Last data filed at 08/23/2019 1300 Gross per 24 hour  Intake 9374.12 ml  Output 3450 ml  Net 5924.12 ml   Filed Weights   08/22/19 2350  08/23/19 0344  Weight: 79.8 kg 80.4 kg    Examination: Physical Exam Constitutional:      Comments: Intubated and sedated  HENT:     Head: Normocephalic and atraumatic.  Neck:     Trachea: No tracheal deviation.  Cardiovascular:     Rate and Rhythm: Normal rate and regular rhythm.     Pulses:          Posterior tibial pulses are 2+ on the right side and 2+ on the left side.     Heart sounds: Normal heart sounds.     Comments:  Sternotomy incision.  No bleeding.  Will mediastinal drains.  Backup pacing in AAI Pulmonary:     Effort: Pulmonary effort is normal.     Breath sounds: Normal breath sounds.  Abdominal:     Palpations: Abdomen is soft.  Skin:    General: Skin is warm.     Capillary Refill: Capillary refill takes less than 2 seconds.  Neurological:     Comments: Sedated with dexmedetomidine.      Ancillary tests (personally reviewed)  CBC: Recent Labs  Lab 08/22/19 2345 08/23/19 0423 08/23/19 0940 08/23/19 1049 08/23/19 1054 08/23/19 1157 08/23/19 1322  WBC 7.8  --   --   --   --   --  9.1  NEUTROABS 5.4  --   --   --   --   --   --   HGB 14.2   < > 9.2* 7.8* 8.5* 8.8* 9.1*  HCT 43.4   < > 27.2* 23.0* 25.0* 26.0* 28.0*  MCV 97.3  --   --   --   --   --  93.6  PLT 171  --  88*  --   --   --  44*   < > = values in this interval not displayed.    Basic Metabolic Panel: Recent Labs  Lab 08/22/19 2345 08/23/19 0423 08/23/19 0555 08/23/19 0555 08/23/19 0713 08/23/19 0718 08/23/19 0907 08/23/19 0926 08/23/19 1049 08/23/19 1054 08/23/19 1157  NA 135   < > 138   < > 136   < > 135 134* 138 137 140  K 4.0   < > 4.4   < > 4.6   < > 4.5 4.5 3.4* 3.4* 3.3*  CL 101  --  103  --  103  --   --  104  --  106  --   CO2 27  --   --   --   --   --   --   --   --   --   --   GLUCOSE 130*  --  113*  --  112*  --   --  186*  --  143*  --   BUN 14  --  12  --  11  --   --  13  --  11  --   CREATININE 1.18  --  0.90  --  0.90  --   --  0.70  --  0.70  --   CALCIUM  8.6*  --   --   --   --   --   --   --   --   --   --    < > = values in this interval not displayed.   GFR: Estimated Creatinine Clearance: 95.7 mL/min (by C-G formula based on SCr of 0.7 mg/dL). Recent Labs  Lab 08/22/19 2345 08/23/19 1322  WBC 7.8 9.1    Liver Function Tests: Recent Labs  Lab 08/22/19 2345  AST 34  ALT 24  ALKPHOS 62  BILITOT 0.7  PROT 7.3  ALBUMIN 4.0   No results for input(s): LIPASE, AMYLASE in the last 168 hours. No results for input(s): AMMONIA in the last 168 hours.  ABG    Component Value Date/Time   PHART 7.243 (L) 08/23/2019 1157   PCO2ART 48.0 08/23/2019 1157   PO2ART 404.0 (H) 08/23/2019 1157   HCO3 20.7 08/23/2019 1157   TCO2 22 08/23/2019 1157   ACIDBASEDEF 6.0 (H) 08/23/2019 1157   O2SAT 100.0 08/23/2019 1157     Coagulation Profile: Recent Labs  Lab 08/22/19 2345 08/23/19 1322  INR 1.0 1.8*    Cardiac Enzymes: No results for input(s): CKTOTAL, CKMB, CKMBINDEX, TROPONINI in the last 168 hours.  HbA1C: Hgb A1c MFr Bld  Date/Time Value Ref Range Status  08/22/2019 11:45 PM 5.8 (H) 4.8 - 5.6 % Final    Comment:    (NOTE) Pre diabetes:          5.7%-6.4% Diabetes:              >6.4% Glycemic control for   <7.0% adults with diabetes     CBG: Recent Labs  Lab 08/23/19 1320  GLUCAP 218*    Assessment & Plan:   Critically ill due to cardiogenic and vasoplegic shock following emergent repair of type a dissection and mitral valve repair.  Unable to float PA catheter from left side  Labile blood pressure likely indicating episodes of pain possible intravascular volume depletion. -FloTrac for hemodynamic optimization -Remove Swan-Ganz catheter and place triple-lumen through introducer for additional vascular access -Continue to titrate epinephrine to maintain normal cardiac output -Titrate norepinephrine to maintain MAP greater than 65 -Avoid hypertension, keep systolic blood pressure less than 140. -Optimize pain  control, dosed with morphine and will add dose of IV Tylenol. -Follow CVP to optimize intravascular volume.  Fluid resuscitate with blood products over crystalloid given potential coagulopathy  Critically ill due to postoperative coagulopathy requiring administration of blood products. 350 mL in first hour decreased to 175 mL in the second hour Thrombocytopenia.  Mild elevation PTT -Platelet transfusion -If continues to bleed will consider DDAVP. Check fibrinogen and consider cryoprecipitate. - Protamine dose appears to have been appropriate for heparin dose and may independently affect APTT  Critically ill due to expected postoperative hypoxic hypercapnic respiratory failure requiring mechanical ventilation. -Continue full ventilatory support until hemostasis ensured and then proceed abbreviated SBT and extubation.  Daily Goals Checklist  Pain/Anxiety/Delirium protocol (if indicated): Dexmedetomidine and morphine as needed VAP protocol (if indicated): Bundle in place Respiratory support goals: Full ventilatory support wean per cardiac surgery protocol Blood pressure target: Keep MAP 65-95 DVT prophylaxis: SCDs Nutritional status and feeding goals: N.p.o. until extubated GI prophylaxis: Famotidine Fluid status goals: Target CVP Urinary catheter: Guide hemodynamic management Central lines: Left internal jugular introducer Glucose control: Per CT surgery protocol Mobility/therapy needs: Bedrest then progressive ambulation postextubation Antibiotic de-escalation: Perioperative antibiotics only Home medication reconciliation: Reconciliation post extubation. Daily labs: CBC BMP Code Status: Full Family Communication: Per cardiac surgery Disposition: ICU  CRITICAL CARE Performed by: Kipp Brood   Total critical care time: 60 minutes  Critical care time was exclusive of separately billable procedures and treating other patients.  Critical care was necessary to treat or prevent  imminent or life-threatening deterioration.  Critical care was time spent personally by me on the following activities: development of treatment plan with patient and/or surrogate as well as nursing, discussions with consultants, evaluation of patient's response to treatment, examination of patient, obtaining history from patient or surrogate, ordering and performing treatments and interventions, ordering and review of laboratory studies, ordering and review of radiographic studies, pulse oximetry, re-evaluation of patient's condition and participation in multidisciplinary rounds.  Kipp Brood, MD [facility] ICU Physician [place]  Pager: [phone] Mobile: [phone] After hours: [phone]  08/23/2019, 2:22 PM

## 2019-08-23 NOTE — Anesthesia Procedure Notes (Signed)
Procedure Name: Intubation Date/Time: 08/23/2019 5:42 AM Performed by: Jearld Pies, CRNA Pre-anesthesia Checklist: Patient identified, Emergency Drugs available, Suction available and Patient being monitored Patient Re-evaluated:Patient Re-evaluated prior to induction Oxygen Delivery Method: Circle System Utilized Preoxygenation: Pre-oxygenation with 100% oxygen Induction Type: IV induction Ventilation: Mask ventilation without difficulty Laryngoscope Size: Mac and 3 Grade View: Grade I Tube type: Oral Tube size: 8.0 mm Number of attempts: 1 Airway Equipment and Method: Stylet Placement Confirmation: ETT inserted through vocal cords under direct vision,  positive ETCO2 and breath sounds checked- equal and bilateral Secured at: 23 cm Tube secured with: Tape Dental Injury: Teeth and Oropharynx as per pre-operative assessment

## 2019-08-23 NOTE — ED Notes (Signed)
Pt transported to CT ?

## 2019-08-23 NOTE — ED Notes (Addendum)
Per Pt he previously tested positive for COVID-19 at [facility] on Dec. 30th, 2020. Pt doesn't have any COVID symptoms at this time. New testing was provided here for our records. Per [facility] pt doesn't need pecautions at this time.

## 2019-08-24 ENCOUNTER — Encounter: Payer: Self-pay | Admitting: *Deleted

## 2019-08-24 ENCOUNTER — Inpatient Hospital Stay (HOSPITAL_COMMUNITY): Payer: Medicare Other

## 2019-08-24 LAB — BPAM PLATELET PHERESIS
Blood Product Expiration Date: 202102082359
Blood Product Expiration Date: 202102082359
Blood Product Expiration Date: 202102092359
ISSUE DATE / TIME: 202102070953
ISSUE DATE / TIME: 202102071548
Unit Type and Rh: 6200
Unit Type and Rh: 6200
Unit Type and Rh: 9500

## 2019-08-24 LAB — PREPARE FRESH FROZEN PLASMA
Unit division: 0
Unit division: 0

## 2019-08-24 LAB — POCT I-STAT 7, (LYTES, BLD GAS, ICA,H+H)
Acid-base deficit: 7 mmol/L — ABNORMAL HIGH (ref 0.0–2.0)
Acid-base deficit: 8 mmol/L — ABNORMAL HIGH (ref 0.0–2.0)
Acid-base deficit: 5 mmol/L — ABNORMAL HIGH (ref 0.0–2.0)
Bicarbonate: 18.1 mmol/L — ABNORMAL LOW (ref 20.0–28.0)
Bicarbonate: 18.6 mmol/L — ABNORMAL LOW (ref 20.0–28.0)
Bicarbonate: 18.8 mmol/L — ABNORMAL LOW (ref 20.0–28.0)
Calcium, Ion: 1.84 mmol/L (ref 1.15–1.40)
Calcium, Ion: 1.84 mmol/L (ref 1.15–1.40)
Calcium, Ion: 1.28 mmol/L (ref 1.15–1.40)
HCT: 24 % — ABNORMAL LOW (ref 39.0–52.0)
HCT: 25 % — ABNORMAL LOW (ref 39.0–52.0)
HCT: 17 % — ABNORMAL LOW (ref 39.0–52.0)
Hemoglobin: 8.2 g/dL — ABNORMAL LOW (ref 13.0–17.0)
Hemoglobin: 8.5 g/dL — ABNORMAL LOW (ref 13.0–17.0)
Hemoglobin: 5.8 g/dL — CL (ref 13.0–17.0)
O2 Saturation: 98 %
O2 Saturation: 98 %
O2 Saturation: 98 %
Patient temperature: 34.5
Patient temperature: 34.6
Patient temperature: 37.3
Potassium: 3.2 mmol/L — ABNORMAL LOW (ref 3.5–5.1)
Potassium: 3.3 mmol/L — ABNORMAL LOW (ref 3.5–5.1)
Potassium: 4.7 mmol/L (ref 3.5–5.1)
Sodium: 142 mmol/L (ref 135–145)
Sodium: 142 mmol/L (ref 135–145)
Sodium: 142 mmol/L (ref 135–145)
TCO2: 19 mmol/L — ABNORMAL LOW (ref 22–32)
TCO2: 20 mmol/L — ABNORMAL LOW (ref 22–32)
TCO2: 20 mmol/L — ABNORMAL LOW (ref 22–32)
pCO2 arterial: 34.5 mmHg (ref 32.0–48.0)
pCO2 arterial: 35.7 mmHg (ref 32.0–48.0)
pCO2 arterial: 30.2 mmHg — ABNORMAL LOW (ref 32.0–48.0)
pH, Arterial: 7.299 — ABNORMAL LOW (ref 7.350–7.450)
pH, Arterial: 7.327 — ABNORMAL LOW (ref 7.350–7.450)
pH, Arterial: 7.402 (ref 7.350–7.450)
pO2, Arterial: 107 mmHg (ref 83.0–108.0)
pO2, Arterial: 109 mmHg — ABNORMAL HIGH (ref 83.0–108.0)
pO2, Arterial: 101 mmHg (ref 83.0–108.0)

## 2019-08-24 LAB — CBC
HCT: 20.7 % — ABNORMAL LOW (ref 39.0–52.0)
HCT: 20 % — ABNORMAL LOW (ref 39.0–52.0)
HCT: 22.8 % — ABNORMAL LOW (ref 39.0–52.0)
HCT: 24.8 % — ABNORMAL LOW (ref 39.0–52.0)
Hemoglobin: 7 g/dL — ABNORMAL LOW (ref 13.0–17.0)
Hemoglobin: 6.8 g/dL — CL (ref 13.0–17.0)
Hemoglobin: 7.8 g/dL — ABNORMAL LOW (ref 13.0–17.0)
Hemoglobin: 8.4 g/dL — ABNORMAL LOW (ref 13.0–17.0)
MCH: 30.4 pg (ref 26.0–34.0)
MCH: 30.8 pg (ref 26.0–34.0)
MCH: 31.1 pg (ref 26.0–34.0)
MCH: 30.8 pg (ref 26.0–34.0)
MCHC: 33.8 g/dL (ref 30.0–36.0)
MCHC: 34 g/dL (ref 30.0–36.0)
MCHC: 34.2 g/dL (ref 30.0–36.0)
MCHC: 33.9 g/dL (ref 30.0–36.0)
MCV: 90 fL (ref 80.0–100.0)
MCV: 90.5 fL (ref 80.0–100.0)
MCV: 90.8 fL (ref 80.0–100.0)
MCV: 90.8 fL (ref 80.0–100.0)
Platelets: 85 10*3/uL — ABNORMAL LOW (ref 150–400)
Platelets: 86 10*3/uL — ABNORMAL LOW (ref 150–400)
Platelets: 74 10*3/uL — ABNORMAL LOW (ref 150–400)
Platelets: 61 10*3/uL — ABNORMAL LOW (ref 150–400)
RBC: 2.3 MIL/uL — ABNORMAL LOW (ref 4.22–5.81)
RBC: 2.21 MIL/uL — ABNORMAL LOW (ref 4.22–5.81)
RBC: 2.51 MIL/uL — ABNORMAL LOW (ref 4.22–5.81)
RBC: 2.73 MIL/uL — ABNORMAL LOW (ref 4.22–5.81)
RDW: 15.6 % — ABNORMAL HIGH (ref 11.5–15.5)
RDW: 16.1 % — ABNORMAL HIGH (ref 11.5–15.5)
RDW: 15.7 % — ABNORMAL HIGH (ref 11.5–15.5)
RDW: 15.5 % (ref 11.5–15.5)
WBC: 6.9 10*3/uL (ref 4.0–10.5)
WBC: 7.7 10*3/uL (ref 4.0–10.5)
WBC: 9.1 10*3/uL (ref 4.0–10.5)
WBC: 8.3 10*3/uL (ref 4.0–10.5)
nRBC: 0 % (ref 0.0–0.2)
nRBC: 0 % (ref 0.0–0.2)
nRBC: 0 % (ref 0.0–0.2)
nRBC: 0 % (ref 0.0–0.2)

## 2019-08-24 LAB — BPAM FFP
Blood Product Expiration Date: 202102122359
Blood Product Expiration Date: 202102122359
Blood Product Expiration Date: 202102122359
ISSUE DATE / TIME: 202102071027
ISSUE DATE / TIME: 202102071027
ISSUE DATE / TIME: 202102071708
Unit Type and Rh: 5100
Unit Type and Rh: 5100
Unit Type and Rh: 600

## 2019-08-24 LAB — BASIC METABOLIC PANEL
Anion gap: 7 (ref 5–15)
Anion gap: 8 (ref 5–15)
Anion gap: 8 (ref 5–15)
BUN: 12 mg/dL (ref 8–23)
BUN: 13 mg/dL (ref 8–23)
BUN: 14 mg/dL (ref 8–23)
CO2: 23 mmol/L (ref 22–32)
CO2: 22 mmol/L (ref 22–32)
CO2: 22 mmol/L (ref 22–32)
Calcium: 8.3 mg/dL — ABNORMAL LOW (ref 8.9–10.3)
Calcium: 8.1 mg/dL — ABNORMAL LOW (ref 8.9–10.3)
Calcium: 8.2 mg/dL — ABNORMAL LOW (ref 8.9–10.3)
Chloride: 110 mmol/L (ref 98–111)
Chloride: 108 mmol/L (ref 98–111)
Chloride: 109 mmol/L (ref 98–111)
Creatinine, Ser: 1.03 mg/dL (ref 0.61–1.24)
Creatinine, Ser: 0.97 mg/dL (ref 0.61–1.24)
Creatinine, Ser: 0.91 mg/dL (ref 0.61–1.24)
GFR calc Af Amer: >60 mL/min (ref >60)
GFR calc Af Amer: >60 mL/min (ref >60)
GFR calc Af Amer: >60 mL/min (ref >60)
GFR calc non Af Amer: >60 mL/min (ref >60)
GFR calc non Af Amer: >60 mL/min (ref >60)
GFR calc non Af Amer: >60 mL/min (ref >60)
Glucose, Bld: 130 mg/dL — ABNORMAL HIGH (ref 70–99)
Glucose, Bld: 124 mg/dL — ABNORMAL HIGH (ref 70–99)
Glucose, Bld: 108 mg/dL — ABNORMAL HIGH (ref 70–99)
Potassium: 5.3 mmol/L — ABNORMAL HIGH (ref 3.5–5.1)
Potassium: 4.8 mmol/L (ref 3.5–5.1)
Potassium: 4.2 mmol/L (ref 3.5–5.1)
Sodium: 140 mmol/L (ref 135–145)
Sodium: 138 mmol/L (ref 135–145)
Sodium: 139 mmol/L (ref 135–145)

## 2019-08-24 LAB — PREPARE PLATELET PHERESIS
Unit division: 0
Unit division: 0
Unit division: 0

## 2019-08-24 LAB — PREPARE CRYOPRECIPITATE: Unit division: 0

## 2019-08-24 LAB — GLUCOSE, CAPILLARY
Glucose-Capillary: 105 mg/dL — ABNORMAL HIGH (ref 70–99)
Glucose-Capillary: 106 mg/dL — ABNORMAL HIGH (ref 70–99)
Glucose-Capillary: 117 mg/dL — ABNORMAL HIGH (ref 70–99)
Glucose-Capillary: 118 mg/dL — ABNORMAL HIGH (ref 70–99)
Glucose-Capillary: 119 mg/dL — ABNORMAL HIGH (ref 70–99)
Glucose-Capillary: 122 mg/dL — ABNORMAL HIGH (ref 70–99)
Glucose-Capillary: 122 mg/dL — ABNORMAL HIGH (ref 70–99)
Glucose-Capillary: 125 mg/dL — ABNORMAL HIGH (ref 70–99)
Glucose-Capillary: 127 mg/dL — ABNORMAL HIGH (ref 70–99)
Glucose-Capillary: 139 mg/dL — ABNORMAL HIGH (ref 70–99)
Glucose-Capillary: 82 mg/dL (ref 70–99)
Glucose-Capillary: 92 mg/dL (ref 70–99)

## 2019-08-24 LAB — PREPARE RBC (CROSSMATCH)

## 2019-08-24 LAB — BPAM CRYOPRECIPITATE
Blood Product Expiration Date: 202102071551
ISSUE DATE / TIME: 202102071028
Unit Type and Rh: 5100

## 2019-08-24 LAB — MAGNESIUM
Magnesium: 1.9 mg/dL (ref 1.7–2.4)
Magnesium: 1.7 mg/dL (ref 1.7–2.4)

## 2019-08-24 MED ORDER — AMIODARONE HCL IN DEXTROSE 360-4.14 MG/200ML-% IV SOLN
30.0000 mg/h | INTRAVENOUS | Status: DC
  Administered 2019-08-24 – 2019-08-25 (×3): 30 mg/h via INTRAVENOUS
  Filled 2019-08-24: qty 200

## 2019-08-24 MED ORDER — SODIUM CHLORIDE 0.9% IV SOLUTION: Freq: Once | INTRAVENOUS | Status: DC

## 2019-08-24 MED ORDER — SODIUM CHLORIDE 0.9% FLUSH
10.0000 mL | Freq: Two times a day (BID) | INTRAVENOUS | Status: DC
  Administered 2019-08-24 – 2019-08-25 (×3): 10 mL

## 2019-08-24 MED ORDER — AMIODARONE HCL IN DEXTROSE 360-4.14 MG/200ML-% IV SOLN
60.0000 mg/h | INTRAVENOUS | Status: DC
  Filled 2019-08-24: qty 400

## 2019-08-24 MED ORDER — ASPIRIN 81 MG PO CHEW
81.0000 mg | CHEWABLE_TABLET | Freq: Every day | ORAL | Status: DC
  Administered 2019-08-24 – 2019-09-01 (×9): 81 mg via ORAL
  Filled 2019-08-24 (×9): qty 1

## 2019-08-24 MED ORDER — THIAMINE HCL 100 MG/ML IJ SOLN
Freq: Once | INTRAVENOUS | Status: AC
  Filled 2019-08-24: qty 1000

## 2019-08-24 MED ORDER — INSULIN ASPART 100 UNIT/ML ~~LOC~~ SOLN
0.0000 [IU] | SUBCUTANEOUS | Status: DC
  Administered 2019-08-26: 2 [IU] via SUBCUTANEOUS

## 2019-08-24 MED ORDER — SODIUM CHLORIDE 0.9% IV SOLUTION: Freq: Once | INTRAVENOUS | Status: AC

## 2019-08-24 MED ORDER — SODIUM CHLORIDE 0.9% FLUSH: 10.0000 mL | INTRAVENOUS | Status: DC | PRN

## 2019-08-24 MED ORDER — AMIODARONE LOAD VIA INFUSION
150.0000 mg | Freq: Once | INTRAVENOUS | Status: AC
  Administered 2019-08-24: 150 mg via INTRAVENOUS
  Filled 2019-08-24: qty 83.34

## 2019-08-24 NOTE — Progress Notes (Signed)
CRITICAL VALUE ALERT  Critical Value:  Hemoglobin 6.8  Date & Time Noted: 08/24/19 0410  Provider Notified: Orvan Seen MD  Orders Received/Actions taken: MD ordered 1 unit PRBC

## 2019-08-24 NOTE — Plan of Care (Signed)
Pt is alert and oriented, has remained calm throughout the night, including extubation and dangle. Pt's breathing is even and unlabored on 2-4 L [place], lungs sound clear, and pt has been educated on incentive and splinting exercises. Pt has had minimal drainage from chest tube insertion site, dressing was changed. Pt has been AAI paced at 90 and is NSR in the 60s underneath. Pacer has maintained on throughout the night for hemodynamic support. Low doses of levophed and epi gtt has supported pt's BP. MD was notified about hemoglobin and 1 unit of PRBC is currently transfusing. Upon morning dangle and scale weight, pt vagaled down. BP decreased into systolic of [id number], pt was immediately assisted down to the bed, legs were raised, oxygen was increased, and levophed gtt was temporarily increased. Pt's BP is now stabilized and pt denies dizziness. PRN pain medication was given. No complaints or concerns voiced at this time. Call bell is within reach and bed is in lowest position.    Problem: Education: Goal: Knowledge of General Education information will improve Description: Including pain rating scale, medication(s)/side effects and non-pharmacologic comfort measures Outcome: Progressing   Problem: Clinical Measurements: Goal: Respiratory complications will improve Outcome: Progressing Goal: Cardiovascular complication will be avoided Outcome: Progressing   Problem: Coping: Goal: Level of anxiety will decrease Outcome: Progressing   Problem: Elimination: Goal: Will not experience complications related to urinary retention Outcome: Progressing   Problem: Pain Managment: Goal: General experience of comfort will improve Outcome: Progressing   Problem: Education: Goal: Knowledge of the prescribed therapeutic regimen will improve Outcome: Progressing   Problem: Clinical Measurements: Goal: Postoperative complications will be avoided or minimized Outcome: Progressing

## 2019-08-24 NOTE — Op Note (Signed)
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  This was so severe that it prompted emergency department presentation at [facility].  Evaluation ultimately demonstrated type a aortic dissection.  He was transferred to [facility] for urgent repair.  Preoperatively, the patient is neurologically intact and shows no signs of heart failure or malperfusion.   DETAILS OF THE OPERATIVE PROCEDURE  Preparation:  The patient is brought to the operating room on the above mentioned date and central monitoring was established by the anesthesia team including placement of Swan-Ganz catheter and radial arterial line. The patient is placed in the supine position on the operating table.  Intravenous antibiotics are administered. General endotracheal anesthesia is induced uneventfully. A Foley catheter is placed.  Baseline  transesophageal echocardiogram was performed.  Findings were notable for severe mitral valve regurgitation, and aortic dissection flap that terminates near the sinotubular junction but is carried into the arch of the aorta.  Minimal aortic valve regurgitation.   The patient's chest, abdomen, both groins, and both lower extremities are prepared and draped in a sterile manner. A time out procedure is performed. The entire pre-bypass portion of the operation was notable for stable hemodynamics.  Surgical Approach and Conduit Harvest:  Attention is first turned to the right infraclavicular fossa where an incision is made and dissection is carried down with electrocautery.  The pectoralis major and minor muscles are divided.  The axillary artery is encircled.  5000 units of heparin is given intravenously.  A 10 mm dacryon graft is sewn end-to-side to the open axillary artery with a running suture of 5-0 Prolene.  This is de-aired and attached to the arterial limb of the cardiopulmonary bypass machine.  Next, median sternotomy incision was performed. The pericardium is opened. The ascending aorta is dilated and ecchymotic in appearance.  Full dose heparinization is delivered centrally.  The superior and inferior vena cava are cannulated for cardiopulmonary bypass.  Adequate heparinization is verified, and the patient is placed on cardiopulmonary bypass.  A left ventricular vent is inserted through the right superior pulmonary vein.  Systemic cooling was then begun to a goal temperature of 24 C.  A retrograde cardioplegia cannula is placed through the right atrium into the coronary sinus.   Extracorporeal Cardiopulmonary Bypass and Myocardial Protection:  The aortic cross clamp is applied and cold blood cardioplegia is delivered initially in a retrograde fashion through the coronary sinus catheter.  The aortic root is opened.  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  Then,  hand-held cardioplegia is given directly into the coronary ostia. Iced saline slush is applied for topical hypothermia. Repeat doses of cardioplegia are administered intermittently throughout the entire cross clamp portion of the operation through the coronary ostia or through the coronary sinus catheter to maintain completely flat electrocardiogram. Once initial arrest was achieved, the intra-atrial groove was dissected.  The left atrium was entered, the mitral valve was inspected.  This demonstrated elongated cords at A2 but no other discernible valvular abnormalities.  Given the other circumstances of emergency operation for aortic dissection the decision was made to perform an Alfieri stitch to address the mitral valve..  This was done with figure-of-eight sutures of 3-0 Prolene from A2 scallop to the P2 scallop.  The left ventricular vent was reinserted across the mitral valve into the left ventricle.  The atrial incision was then closed in layers.   By this time the patient had been cooling for approximately 45 minutes.  The core temperature was approximately 25 degrees.  Circulatory arrest was then undertaken and 1 minute after turning the pop-off flow was reestablished to the brain through the right axillary artery cannula having clamped the base of the innominate artery which was free of disease and the base of the left carotid artery.  Cerebral saturations were monitored and the circulatory arrest was pretreated with intravenous steroids.  The head was also packed in ice.  The remainder of the ascending aorta was resected underneath the arch.  The initial tear could be seen originating anterior and superior between the takeoff of the left carotid and left subclavian vessels.  A 30 mm dacryon graft was brought onto the field and beveled.  This was sewn to the underside of the arch with a running suture of 3-0 Prolene.  Along the inner curve arch, neomedia creation was  performed with felt BioGlue.  Once the anastomosis was completed de-airing of the graft was performed and the graft itself was clamped reestablishing flow to the body.  The circulatory arrest time was 31 minutes with 30 minutes of antegrade cerebral perfusion and near baseline cerebral saturations maintained throughout.  Next, attention was turned to the proximal anastomosis.  The aneurysm tissue was dissected down to the level of the sinotubular junction.  A 3 aortic valve commissures were resuspended.  A piece of the 30 mm dacryon graft was sewn into into the residual portion of the native aorta with a running suture of 3-0 Prolene.  There was good size match to the anastomosis.  When this was completed graft to graft anastomosis was performed with a 4-0 Prolene running suture.  A warming dose of cardioplegia was given retrograde.  De-airing procedures were performed and the aortic cross-clamp was removed.    Procedure Completion:   Epicardial pacing wires are fixed to the right ventricular outflow tract and to the right atrial appendage. The patient is rewarmed to 37C temperature. The patient is weaned and disconnected from cardiopulmonary bypass.  The patient's rhythm at separation from bypass was NSR.  The patient was weaned from cardiopulmonary bypass with moderate inotropic support.   Followup transesophageal echocardiogram performed after separation from bypass revealed no  AI and mild MR.  The heart was decannulated;  protamine was administered to reverse the anticoagulation. Blood products were required to correct associated coagulopathy. The mediastinum and pleural space were inspected for hemostasis and irrigated with saline solution. The mediastinum was drained with fluted chest tubes placed through separate stab incisions inferiorly.  The soft tissues anterior to the  aorta were reapproximated loosely. The sternum is closed with double strength sternal wire. The soft tissues anterior to the  sternum were closed in multiple layers and the skin is closed with a running subcuticular skin closure.    Disposition:  The patient tolerated the procedure well and is transported to the surgical intensive care in stable condition. There are no intraoperative complications. All sponge instrument and needle counts are verified correct at completion of the operation.    Jayme Cloud, MD 08/24/2019 11:45 AM

## 2019-08-24 NOTE — Progress Notes (Signed)
      [address]       [place], [address]             [phone]      POD # 1 aortic dissection  BP 124/79 (BP Location: Left Arm)   Pulse 80   Temp 99.1 F (37.3 C)   Resp (!) 22   Ht 6' (1.829 m)   Wt 83 kg   SpO2 94%   BMI 24.82 kg/m    Intake/Output Summary (Last 24 hours) at 08/24/2019 1650 Last data filed at 08/24/2019 1500 Gross per 24 hour  Intake 5686.62 ml  Output 1960 ml  Net 3726.62 ml   Vagal episode with getting OOB earlier  CVp relatively low- received 1 unit PRBC- will check Hct again and transfuse if needed  Remo Lipps C. Roxan Hockey, MD [facility] [phone]

## 2019-08-24 NOTE — Progress Notes (Signed)
1 Day Post-Op Procedure(s) (LRB): REPAIR OF TYPE A AORTIC DISSECTION USING HEMASHIELF PLATINUM 30MM VASCULAR GRAFT (N/A) Mitral Valve Repair (Mvr) Subjective: No complaints  Objective: Vital signs in last 24 hours: Temp:  [93.6 F (34.2 C)-100 F (37.8 C)] 99 F (37.2 C) (02/08 0800) Pulse Rate:  [70-106] 70 (02/08 0800) Cardiac Rhythm: Normal sinus rhythm (02/08 0800) Resp:  [0-29] 19 (02/08 0800) BP: (83-150)/(51-104) 112/68 (02/08 0800) SpO2:  [92 %-100 %] 100 % (02/08 0800) Arterial Line BP: (61-212)/(42-106) 117/63 (02/08 0800) FiO2 (%):  [40 %] 40 % (02/07 2003) Weight:  [83 kg] 83 kg (02/08 0500)  Hemodynamic parameters for last 24 hours: PAP: (20-35)/(3-14) 20/14 CVP:  [2 mmHg-46 mmHg] 3 mmHg  Intake/Output from previous day: 02/07 0701 - 02/08 0700 In: 13710.2 [P.O.:180; I.V.:5826.4; Blood:5114; IV Piggyback:2589.9] Out: [id number] [Urine:2000; Blood:2200; Chest Tube:1350] Intake/Output this shift: Total I/O In: 699.9 [I.V.:599.9; IV Piggyback:100] Out: -   General appearance: alert and cooperative Neurologic: intact Heart: regular rate and rhythm, S1, S2 normal, no murmur, click, rub or gallop Lungs: clear to auscultation bilaterally Abdomen: soft, non-tender; bowel sounds normal; no masses,  no organomegaly Extremities: extremities normal, atraumatic, no cyanosis or edema Wound: c/d/i  Lab Results: Recent Labs    08/23/19 2016 08/23/19 2016 08/23/19 2033 08/24/19 0331  WBC 6.9  --   --  7.7  HGB 7.0*   < > 5.8* 6.8*  HCT 20.7*   < > 17.0* 20.0*  PLT 85*  --   --  86*   < > = values in this interval not displayed.   BMET:  Recent Labs    08/24/19 0331 08/24/19 0754  NA 140 138  K 5.3* 4.8  CL 110 108  CO2 23 22  GLUCOSE 130* 124*  BUN 12 13  CREATININE 1.03 0.97  CALCIUM 8.3* 8.1*    PT/INR:  Recent Labs    08/23/19 1322  LABPROT 20.9*  INR 1.8*   ABG    Component Value Date/Time   PHART 7.402 08/23/2019 2033   HCO3 18.8 (L)  08/23/2019 2033   TCO2 20 (L) 08/23/2019 2033   ACIDBASEDEF 5.0 (H) 08/23/2019 2033   O2SAT 98.0 08/23/2019 2033   CBG (last 3)  Recent Labs    08/24/19 0606 08/24/19 0705 08/24/19 0753  GLUCAP 122* 119* 122*    Assessment/Plan: S/P Procedure(s) (LRB): REPAIR OF TYPE A AORTIC DISSECTION USING HEMASHIELF PLATINUM 30MM VASCULAR GRAFT (N/A) Mitral Valve Repair (Mvr) Mobilize amiodarone load   LOS: 1 day    Wonda Olds 08/24/2019

## 2019-08-25 ENCOUNTER — Inpatient Hospital Stay (HOSPITAL_COMMUNITY): Payer: Medicare Other

## 2019-08-25 LAB — CBC WITH DIFFERENTIAL/PLATELET
Abs Immature Granulocytes: 0.04 10*3/uL (ref 0.00–0.07)
Basophils Absolute: 0 10*3/uL (ref 0.0–0.1)
Basophils Relative: 0 %
Eosinophils Absolute: 0 10*3/uL (ref 0.0–0.5)
Eosinophils Relative: 0 %
HCT: 24.4 % — ABNORMAL LOW (ref 39.0–52.0)
Hemoglobin: 8.2 g/dL — ABNORMAL LOW (ref 13.0–17.0)
Immature Granulocytes: 0 %
Lymphocytes Relative: 13 %
Lymphs Abs: 1.2 10*3/uL (ref 0.7–4.0)
MCH: 30.9 pg (ref 26.0–34.0)
MCHC: 33.6 g/dL (ref 30.0–36.0)
MCV: 92.1 fL (ref 80.0–100.0)
Monocytes Absolute: 1 10*3/uL (ref 0.1–1.0)
Monocytes Relative: 11 %
Neutro Abs: 6.9 10*3/uL (ref 1.7–7.7)
Neutrophils Relative %: 76 %
Platelets: 64 10*3/uL — ABNORMAL LOW (ref 150–400)
RBC: 2.65 MIL/uL — ABNORMAL LOW (ref 4.22–5.81)
RDW: 15.9 % — ABNORMAL HIGH (ref 11.5–15.5)
WBC: 9.2 10*3/uL (ref 4.0–10.5)
nRBC: 0 % (ref 0.0–0.2)

## 2019-08-25 LAB — BASIC METABOLIC PANEL
Anion gap: 7 (ref 5–15)
BUN: 15 mg/dL (ref 8–23)
CO2: 24 mmol/L (ref 22–32)
Calcium: 8 mg/dL — ABNORMAL LOW (ref 8.9–10.3)
Chloride: 107 mmol/L (ref 98–111)
Creatinine, Ser: 0.86 mg/dL (ref 0.61–1.24)
GFR calc Af Amer: >60 mL/min (ref >60)
GFR calc non Af Amer: >60 mL/min (ref >60)
Glucose, Bld: 94 mg/dL (ref 70–99)
Potassium: 4.1 mmol/L (ref 3.5–5.1)
Sodium: 138 mmol/L (ref 135–145)

## 2019-08-25 LAB — TYPE AND SCREEN
ABO/RH(D): O POS
Antibody Screen: NEGATIVE
Unit division: 0
Unit division: 0
Unit division: 0
Unit division: 0

## 2019-08-25 LAB — BPAM RBC
Blood Product Expiration Date: 202103092359
Blood Product Expiration Date: 202103092359
Blood Product Expiration Date: 202103092359
Blood Product Expiration Date: 202103102359
ISSUE DATE / TIME: 202102070953
ISSUE DATE / TIME: 202102070953
ISSUE DATE / TIME: 202102080459
ISSUE DATE / TIME: 202102081142
Unit Type and Rh: 5100
Unit Type and Rh: 5100
Unit Type and Rh: 5100
Unit Type and Rh: 5100

## 2019-08-25 LAB — SURGICAL PATHOLOGY

## 2019-08-25 LAB — GLUCOSE, CAPILLARY
Glucose-Capillary: 100 mg/dL — ABNORMAL HIGH (ref 70–99)
Glucose-Capillary: 110 mg/dL — ABNORMAL HIGH (ref 70–99)
Glucose-Capillary: 112 mg/dL — ABNORMAL HIGH (ref 70–99)
Glucose-Capillary: 72 mg/dL (ref 70–99)
Glucose-Capillary: 76 mg/dL (ref 70–99)
Glucose-Capillary: 76 mg/dL (ref 70–99)

## 2019-08-25 LAB — HEMOGLOBIN A1C
Hgb A1c MFr Bld: 5.8 % — ABNORMAL HIGH (ref 4.8–5.6)
Mean Plasma Glucose: 120 mg/dL

## 2019-08-25 MED ORDER — AMIODARONE HCL 200 MG PO TABS
400.0000 mg | ORAL_TABLET | Freq: Two times a day (BID) | ORAL | Status: DC
  Administered 2019-08-25 – 2019-08-26 (×3): 400 mg via ORAL
  Filled 2019-08-25 (×3): qty 2

## 2019-08-25 MED ORDER — CAPTOPRIL 6.25 MG HALF TABLET
6.2500 mg | ORAL_TABLET | Freq: Three times a day (TID) | ORAL | Status: DC
  Administered 2019-08-25 – 2019-09-01 (×22): 6.25 mg via ORAL
  Filled 2019-08-25 (×25): qty 1

## 2019-08-25 MED ORDER — FUROSEMIDE 10 MG/ML IJ SOLN
6.0000 mg/h | INTRAVENOUS | Status: DC
  Administered 2019-08-25: 6 mg/h via INTRAVENOUS
  Filled 2019-08-25: qty 25

## 2019-08-25 MED ORDER — COLCHICINE 0.6 MG PO TABS
0.6000 mg | ORAL_TABLET | Freq: Every day | ORAL | Status: DC
  Administered 2019-08-25 – 2019-09-01 (×8): 0.6 mg via ORAL
  Filled 2019-08-25 (×8): qty 1

## 2019-08-25 MED ORDER — FUROSEMIDE 10 MG/ML IJ SOLN
40.0000 mg | Freq: Once | INTRAMUSCULAR | Status: AC
  Administered 2019-08-25: 40 mg via INTRAVENOUS
  Filled 2019-08-25: qty 4

## 2019-08-25 NOTE — Progress Notes (Signed)
EVENING ROUNDS NOTE :     [address]       [place] [address]             [phone]                 2 Days Post-Op Procedure(s) (LRB): REPAIR OF TYPE A AORTIC DISSECTION USING HEMASHIELF PLATINUM 30MM VASCULAR GRAFT (N/A) Mitral Valve Repair (Mvr)   Total Length of Stay:  LOS: 2 days  Events:  No events Up in chair    BP 120/86   Pulse 79   Temp 98.7 F (37.1 C) (Oral)   Resp (!) 21   Ht 6' (1.829 m)   Wt 90.5 kg   SpO2 95%   BMI 27.06 kg/m   CVP:  [3 mmHg-5 mmHg] 4 mmHg     . sodium chloride Stopped (08/24/19 1741)  . sodium chloride    . sodium chloride Stopped (08/24/19 1700)  . furosemide (LASIX) infusion 6 mg/hr (08/25/19 1557)  . lactated ringers    . lactated ringers 20 mL/hr at 08/25/19 1200  . lactated ringers 20 mL/hr at 08/23/19 1700    I/O last 3 completed shifts: In: 4806.2 [P.O.:180; I.V.:3275.6; Blood:635; IV Piggyback:715.6] Out: 2645 [Urine:1415; Chest Tube:1230]   CBC Latest Ref Rng & Units 08/25/2019 08/24/2019 08/24/2019  WBC 4.0 - 10.5 K/uL 9.2 8.3 9.1  Hemoglobin 13.0 - 17.0 g/dL 8.2(L) 8.4(L) 7.8(L)  Hematocrit 39.0 - 52.0 % 24.4(L) 24.8(L) 22.8(L)  Platelets 150 - 400 K/uL 64(L) 61(L) 74(L)    BMP Latest Ref Rng & Units 08/25/2019 08/24/2019 08/24/2019  Glucose 70 - 99 mg/dL 94 108(H) 124(H)  BUN 8 - 23 mg/dL 15 14 13   Creatinine 0.61 - 1.24 mg/dL 0.86 0.91 0.97  Sodium 135 - 145 mmol/L 138 139 138  Potassium 3.5 - 5.1 mmol/L 4.1 4.2 4.8  Chloride 98 - 111 mmol/L 107 109 108  CO2 22 - 32 mmol/L 24 22 22   Calcium 8.9 - 10.3 mg/dL 8.0(L) 8.2(L) 8.1(L)    ABG    Component Value Date/Time   PHART 7.402 08/23/2019 2033   PCO2ART 30.2 (L) 08/23/2019 2033   PO2ART 101.0 08/23/2019 2033   HCO3 18.8 (L) 08/23/2019 2033   TCO2 20 (L) 08/23/2019 2033   ACIDBASEDEF 5.0 (H) 08/23/2019 2033   O2SAT 98.0 08/23/2019 2033       Melodie Bouillon, MD 08/25/2019 5:32 PM

## 2019-08-26 ENCOUNTER — Inpatient Hospital Stay (HOSPITAL_COMMUNITY): Payer: Medicare Other

## 2019-08-26 LAB — CBC
HCT: 24.6 % — ABNORMAL LOW (ref 39.0–52.0)
Hemoglobin: 8.1 g/dL — ABNORMAL LOW (ref 13.0–17.0)
MCH: 30.9 pg (ref 26.0–34.0)
MCHC: 32.9 g/dL (ref 30.0–36.0)
MCV: 93.9 fL (ref 80.0–100.0)
Platelets: 79 10*3/uL — ABNORMAL LOW (ref 150–400)
RBC: 2.62 MIL/uL — ABNORMAL LOW (ref 4.22–5.81)
RDW: 15.6 % — ABNORMAL HIGH (ref 11.5–15.5)
WBC: 9.4 10*3/uL (ref 4.0–10.5)
nRBC: 0.5 % — ABNORMAL HIGH (ref 0.0–0.2)

## 2019-08-26 LAB — GLUCOSE, CAPILLARY
Glucose-Capillary: 101 mg/dL — ABNORMAL HIGH (ref 70–99)
Glucose-Capillary: 102 mg/dL — ABNORMAL HIGH (ref 70–99)
Glucose-Capillary: 104 mg/dL — ABNORMAL HIGH (ref 70–99)
Glucose-Capillary: 104 mg/dL — ABNORMAL HIGH (ref 70–99)
Glucose-Capillary: 136 mg/dL — ABNORMAL HIGH (ref 70–99)
Glucose-Capillary: 97 mg/dL (ref 70–99)

## 2019-08-26 LAB — BASIC METABOLIC PANEL
Anion gap: 8 (ref 5–15)
BUN: 14 mg/dL (ref 8–23)
CO2: 27 mmol/L (ref 22–32)
Calcium: 7.6 mg/dL — ABNORMAL LOW (ref 8.9–10.3)
Chloride: 102 mmol/L (ref 98–111)
Creatinine, Ser: 0.92 mg/dL (ref 0.61–1.24)
GFR calc Af Amer: >60 mL/min (ref >60)
GFR calc non Af Amer: >60 mL/min (ref >60)
Glucose, Bld: 95 mg/dL (ref 70–99)
Potassium: 3.2 mmol/L — ABNORMAL LOW (ref 3.5–5.1)
Sodium: 137 mmol/L (ref 135–145)

## 2019-08-26 LAB — ECHO INTRAOPERATIVE TEE
Height: 72 in
Weight: 2836 oz

## 2019-08-26 MED ORDER — POTASSIUM CHLORIDE CRYS ER 20 MEQ PO TBCR
20.0000 meq | EXTENDED_RELEASE_TABLET | ORAL | Status: AC
  Administered 2019-08-26 (×3): 20 meq via ORAL
  Filled 2019-08-26 (×3): qty 1

## 2019-08-26 MED ORDER — AMIODARONE HCL 200 MG PO TABS
200.0000 mg | ORAL_TABLET | Freq: Every day | ORAL | Status: DC
  Administered 2019-08-27 – 2019-08-31 (×5): 200 mg via ORAL
  Filled 2019-08-26 (×5): qty 1

## 2019-08-26 MED FILL — Lidocaine HCl Local Preservative Free (PF) Inj 2%: INTRAMUSCULAR | Qty: 15 | Status: AC

## 2019-08-26 MED FILL — Heparin Sodium (Porcine) Inj 1000 Unit/ML: INTRAMUSCULAR | Qty: 30 | Status: AC

## 2019-08-26 MED FILL — Potassium Chloride Inj 2 mEq/ML: INTRAVENOUS | Qty: 40 | Status: AC

## 2019-08-26 NOTE — Progress Notes (Signed)
[facility] DAILY ICU PROGRESS NOTE                   [address]            [place] [address]          [phone]   3 Days Post-Op Procedure(s) (LRB): REPAIR OF TYPE A AORTIC DISSECTION USING HEMASHIELF PLATINUM 30MM VASCULAR GRAFT (N/A) Mitral Valve Repair (Mvr)  Total Length of Stay:  LOS: 3 days   Subjective: Up in the chair, says he is comfortable. No new concerns.   Objective: Vital signs in last 24 hours: Temp:  [97.3 F (36.3 C)-98.8 F (37.1 C)] 97.7 F (36.5 C) (02/10 0747) Pulse Rate:  [60-85] 64 (02/10 0900) Cardiac Rhythm: Normal sinus rhythm (02/10 0800) Resp:  [14-27] 17 (02/10 0900) BP: (89-121)/(58-75) 105/74 (02/10 0900) SpO2:  [93 %-99 %] 98 % (02/10 0900) Arterial Line BP: (91-144)/(45-66) 91/45 (02/09 1600) Weight:  [87.4 kg] 87.4 kg (02/10 0600)  Filed Weights   08/24/19 0500 08/25/19 0600 08/26/19 0600  Weight: 83 kg 90.5 kg 87.4 kg    Weight change: -3.1 kg     Intake/Output from previous day: 02/09 0701 - 02/10 0700 In: 714.4 [I.V.:714.4] Out: 4165 [Urine:3785; Chest Tube:380]  Intake/Output this shift: Total I/O In: 51.9 [I.V.:51.9] Out: 635 [Urine:625; Chest Tube:10]  Current Meds: Scheduled Meds: . sodium chloride   Intravenous Once  . acetaminophen  1,000 mg Oral Q6H   Or  . acetaminophen (TYLENOL) oral liquid 160 mg/5 mL  1,000 mg Per Tube Q6H  . amiodarone  400 mg Oral BID  . aspirin  81 mg Oral Daily  . bisacodyl  10 mg Oral Daily   Or  . bisacodyl  10 mg Rectal Daily  . captopril  6.25 mg Oral TID  . Chlorhexidine Gluconate Cloth  6 each Topical Daily  . colchicine  0.6 mg Oral Daily  . docusate sodium  200 mg Oral Daily  . insulin aspart  0-24 Units Subcutaneous Q4H  . pantoprazole  40 mg Oral Daily  . potassium chloride  20 mEq Oral Q4H  . sodium chloride flush  10-40 mL Intracatheter Q12H  . sodium chloride flush  3 mL Intravenous Q12H   Continuous Infusions: . sodium chloride Stopped (08/24/19  1741)  . sodium chloride    . sodium chloride Stopped (08/24/19 1700)  . furosemide (LASIX) infusion 6 mg/hr (08/26/19 0900)  . lactated ringers    . lactated ringers 20 mL/hr at 08/26/19 0900  . lactated ringers 20 mL/hr at 08/23/19 1700   PRN Meds:.sodium chloride, fentaNYL (SUBLIMAZE) injection, lactated ringers, metoprolol tartrate, ondansetron (ZOFRAN) IV, oxyCODONE, sodium chloride flush, sodium chloride flush, traMADol  General appearance: alert, cooperative and no distress Neurologic: intact Heart: regular rate and rhythm Lungs: Breath sounds clear, minimal CT drainage.  Abdomen: Soft and NT.  Extremities: All well perfused  Lab Results: CBC: Recent Labs    08/25/19 0339 08/26/19 0500  WBC 9.2 9.4  HGB 8.2* 8.1*  HCT 24.4* 24.6*  PLT 64* 79*   BMET:  Recent Labs    08/25/19 0339 08/26/19 0500  NA 138 137  K 4.1 3.2*  CL 107 102  CO2 24 27  GLUCOSE 94 95  BUN 15 14  CREATININE 0.86 0.92  CALCIUM 8.0* 7.6*    CMET: Lab Results  Component Value Date   WBC 9.4 08/26/2019   HGB 8.1 (L) 08/26/2019   HCT 24.6 (L) 08/26/2019  PLT 79 (L) 08/26/2019   GLUCOSE 95 08/26/2019   CHOL 155 08/22/2019   TRIG 110 08/22/2019   HDL 47 08/22/2019   LDLCALC 86 08/22/2019   ALT 24 08/22/2019   AST 34 08/22/2019   NA 137 08/26/2019   K 3.2 (L) 08/26/2019   CL 102 08/26/2019   CREATININE 0.92 08/26/2019   BUN 14 08/26/2019   CO2 27 08/26/2019   INR 1.8 (H) 08/23/2019   HGBA1C 5.8 (H) 08/24/2019      PT/INR:  Recent Labs    08/23/19 1322  LABPROT 20.9*  INR 1.8*   Radiology: DG Chest 1 View  Result Date: 08/26/2019 CLINICAL DATA:  Post open heart surgery.  Chest tube present. EXAM: CHEST  1 VIEW COMPARISON:  08/25/2019; 08/24/2019; 08/23/2019 FINDINGS: Grossly unchanged enlarged cardiac silhouette and mediastinal contours post median sternotomy. Stable positioning of support apparatus. No pneumothorax. Pulmonary vasculature remains indistinct with  cephalization of flow. Unchanged small left-sided effusion with associated left mid/lower lung heterogeneous/consolidative opacities. Perihilar opacities are unchanged. No new focal airspace opacities. Surgical clips overlie the right axilla. No acute osseous abnormalities. IMPRESSION: 1.  Stable positioning of support apparatus.  No pneumothorax. 2. Similar findings of pulmonary edema, small left-sided effusion and associated left basilar and perihilar opacities, likely atelectasis. Electronically Signed   By: Sandi Mariscal M.D.   On: 08/26/2019 08:54     Assessment/Plan: S/P Procedure(s) (LRB): REPAIR OF TYPE A AORTIC DISSECTION USING HEMASHIELF PLATINUM 30MM VASCULAR GRAFT (N/A) Mitral Valve Repair (Mvr)  -POD3 repair Type A aortic dissection with a straight ascending aortic graft and MV repair for MR. Hemodynamics stable. D/C'd  chest tubes and pacer wires this AM. Advance activity. Transfer to progressive care.   -Volume excess- good response to Lasix drip yesterday.D/C infusion this AM.   -Hypokalemia- replacement ordered, monitor.   -Expected acute blood loss anemia- Transfused early post-op- Hct is stable with no indication for further transfusion.   -Thrombocytopenia-Plt count trending up. Monitor.    Antony Odea, PA-C [phone] 08/26/2019 9:30 AM

## 2019-08-26 NOTE — Progress Notes (Signed)
3 Days Post-Op Procedure(s) (LRB): REPAIR OF TYPE A AORTIC DISSECTION USING HEMASHIELF PLATINUM 30MM VASCULAR GRAFT (N/A) Mitral Valve Repair (Mvr) Subjective: No complaints Objective: Vital signs in last 24 hours: Temp:  [97.3 F (36.3 C)-98.8 F (37.1 C)] 97.7 F (36.5 C) (02/10 0747) Pulse Rate:  [60-85] 65 (02/10 0930) Cardiac Rhythm: Normal sinus rhythm (02/10 0800) Resp:  [14-27] 21 (02/10 0930) BP: (89-121)/(58-75) 112/67 (02/10 0930) SpO2:  [93 %-99 %] 98 % (02/10 0930) Arterial Line BP: (91-144)/(45-66) 91/45 (02/09 1600) Weight:  [87.4 kg] 87.4 kg (02/10 0600)  Hemodynamic parameters for last 24 hours:    Intake/Output from previous day: 02/09 0701 - 02/10 0700 In: 714.4 [I.V.:714.4] Out: 4165 [Urine:3785; Chest Tube:380] Intake/Output this shift: Total I/O In: 51.9 [I.V.:51.9] Out: 635 [Urine:625; Chest Tube:10]  General appearance: alert and cooperative Neurologic: intact Heart: regular rate and rhythm, S1, S2 normal, no murmur, click, rub or gallop Lungs: clear to auscultation bilaterally Wound: dressed, dry  Lab Results: Recent Labs    08/25/19 0339 08/26/19 0500  WBC 9.2 9.4  HGB 8.2* 8.1*  HCT 24.4* 24.6*  PLT 64* 79*   BMET:  Recent Labs    08/25/19 0339 08/26/19 0500  NA 138 137  K 4.1 3.2*  CL 107 102  CO2 24 27  GLUCOSE 94 95  BUN 15 14  CREATININE 0.86 0.92  CALCIUM 8.0* 7.6*    PT/INR:  Recent Labs    08/23/19 1322  LABPROT 20.9*  INR 1.8*   ABG    Component Value Date/Time   PHART 7.402 08/23/2019 2033   HCO3 18.8 (L) 08/23/2019 2033   TCO2 20 (L) 08/23/2019 2033   ACIDBASEDEF 5.0 (H) 08/23/2019 2033   O2SAT 98.0 08/23/2019 2033   CBG (last 3)  Recent Labs    08/25/19 2331 08/26/19 0339 08/26/19 0748  GLUCAP 100* 102* 136*    Assessment/Plan: S/P Procedure(s) (LRB): REPAIR OF TYPE A AORTIC DISSECTION USING HEMASHIELF PLATINUM 30MM VASCULAR GRAFT (N/A) Mitral Valve Repair (Mvr) Mobilize Diuresis See  progression orders   LOS: 3 days    Henry Hayden 08/26/2019

## 2019-08-26 NOTE — Discharge Summary (Signed)
Physician Discharge Summary  Patient ID: Henry Hayden MRN: [id number] DOB/AGE: [birth year] 69 y.o.  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 He had never experienced such before. Due to the severity of the pain, he presented to ED. Work-up at [facility] demonstrated Type A Aortic Dissection. Hemodynamically stable and pain resolved with NTG. Txferred to [facility] for repair, remains stable and now sx-free. Importantly, he was COVID-19 positive in late Dec 2020, was treated with abx and steroids, and has been sx free for several weeks, yet his COVID testing remains positive. He is not considered infective.   Hospital Course:  Mr. Mcelravy remained hemodynamically stable and pain-free.  He was transferred from [facility] to [facility].  He was prepared and taken to the operating room emergently on 08/23/2019.  Transesophageal echocardiogram obtained in the operating room after induction of anesthesia demonstrated moderate to severe mitral insufficiency.  We proceeded with emergent repair of the type A aortic dissection with a 30 mm Hemashield straight graft.  Repair of the mitral valve was also carried out by way of a left atriotomy.  Please see the operative note for details.  Following the procedure, patient had a significant coagulopathy that was treated with multiple blood products including packed red cells, platelets, cryoprecipitate, and fresh frozen  plasma.  He remained hemodynamically stable in the cardiovascular ICU.  He was transfused further for expected acute blood loss anemia.  He was weaned from the ventilator and extubated by the evening to 02/02/2020.  His cardiac rhythm remained stable.  He was diuresed aggressively with Lasix drip and responded well.  His respiratory status remained stable.  Activity was advanced and well-tolerated.  By the third postoperative day, the chest tubes and pacer wires were removed.  He was transferred to progressive care.  Activity and diet were advanced. He was diuresed aggressively for significant volume overload and was gradually weaned from supplemental oxygen. Follow up CXR on post-op day 4 demonstrated a moderate left pleural effusion.  Ultrasound guided thoracentesis was performed by interventional radiology on 08/27/19 with 432ml bloody fluid evacuated.  Repeat CTA of the thoracic aorta was also done on 08/27/19 to evaluate the repair. This demonstrated no complicating features of the ascending aortic repair, no significant peri-aortic hematoma, and no distal aortic abnormality.  He was felt to be ready for discharge on 08/31/19.     Consults: cardiology  Significant Diagnostic Studies:    CT ANGIOGRAPHY CHEST WITH CONTRAST (pre-op)  TECHNIQUE: Multidetector CT imaging of the chest was performed using the standard protocol during bolus administration of intravenous contrast. Multiplanar CT image reconstructions and MIPs were obtained to evaluate the vascular anatomy.  CONTRAST:  170mL OMNIPAQUE IOHEXOL 350 MG/ML SOLN  COMPARISON:  None.  FINDINGS: Cardiovascular: Contrast injection is sufficient to demonstrate satisfactory opacification of the pulmonary arteries to the segmental level.The main pulmonary artery is not significantly dilated. 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 The dissection begins approximately 2.5 cm superior to the aortic valve. The dissection  is suboptimally evaluated secondary to contrast bolus timing. The arch vessels appear to be grossly patent, however extension of the dissection flap into these vessels is difficult to entirely exclude. The ascending aorta measures approximately 4.5 cm in diameter and is aneurysmal at this diameter. The dissection appears to terminate at the level of the mid descending thoracic aorta. The dissection does not appear to extend into the abdominal aorta. The heart size is normal. There is no significant pericardial effusion. Advanced coronary artery calcifications are noted. There is some mild reflux of contrast in the IVC. There is significant right atrial enlargement.  Mediastinum/Nodes:  --No mediastinal or hilar lymphadenopathy.  --No axillary lymphadenopathy.  --No supraclavicular lymphadenopathy.  --Normal thyroid gland.  --The esophagus is unremarkable  Lungs/Pleura: There are pulmonary opacities in the right lower lobe. There are few subtle ground-glass airspace opacities in the left lower lobe. There is no pneumothorax or large pleural effusion. The trachea is unremarkable.  Upper Abdomen: There is a nonobstructing stone in the upper pole the right kidney. There is some narrowing at the origin of the celiac axis of doubtful clinical significance.  Musculoskeletal: No chest wall abnormality. No acute or significant osseous findings.  Review of the MIP images confirms the above findings.  IMPRESSION: 1. No acute pulmonary embolism. 2. Acute appearing Stanford type A dissection as detailed above. This dissection is suboptimally evaluated on this exam secondary to contrast timing. The arch vessels appear to be grossly patent, but are suboptimally evaluated. 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 3. Subtle reflux of contrast in the IVC suggestive right-sided heart failure. 4. Significant right atrial enlargement is noted. 5. Mild  cardiomegaly with coronary artery disease. 6. Bibasilar airspace opacities, right worse than left may represent residual infiltrates in the setting of a prior COVID-19 diagnosis. 7. Nonobstructing stone in the upper pole the right kidney.  These results were called by telephone at the time of interpretation on 08/23/2019 at 2:03 am to provider IVA KNAPP , who verbally acknowledged these results.   Electronically Signed   By: Constance Holster M.D.   On: 08/23/2019 02:07   CT ANGIOGRAPHY CHEST WITH CONTRAST  (post-op)  TECHNIQUE: Multidetector CT imaging of the chest was performed using the standard protocol during bolus administration of intravenous contrast. Multiplanar CT image reconstructions and MIPs were obtained to evaluate the vascular anatomy.  CONTRAST:  84mL OMNIPAQUE IOHEXOL 350 MG/ML SOLN  COMPARISON:  08/23/2019.  FINDINGS: Cardiovascular: Since the prior CT scan, the aortic dissection has been repaired. There is no residual dissection of the ascending aorta. Ascending aorta is normal in caliber, 3 cm in diameter. There is low attenuation along the margin of the thoracic aorta from the distal arch through the descending portion consistent with edema. Descending thoracic aorta is normal in caliber, without atherosclerotic change. Aortic arch branch vessels are widely patent, without dissection.  Heart is top-normal in size. There is pericardial fluid or edema. Three-vessel coronary artery calcifications are noted.  Central pulmonary arteries are unremarkable.  Mediastinum/Nodes: There is postsurgical edema along the anterior mediastinum without a defined hematoma or mass. No mediastinal or hilar adenopathy. Trachea is widely patent. Esophagus is unremarkable.  Lungs/Pleura: Small right and minimal left pleural effusions. There is dependent opacity in both lower lobes consistent with atelectasis. Minor linear atelectasis is noted at the bases of  the right middle lobe and left upper lobe lingula. Remainder of the lungs is clear. No pulmonary edema. No pneumothorax.  Upper Abdomen:  Limited visualization of the upper abdomen shows no acute or significant abnormalities.  Musculoskeletal: No fracture or acute finding.  No bone lesion.  Review of the MIP images confirms the above findings.  IMPRESSION: 1. Status postoperative repair of an aortic dissection. There is no residual dissection or evidence of a complication of the repair. The ascending aorta, arch and descending aorta are normal in caliber. 2. Small amount of pericardial edema/fluid. Postoperative edema noted along the anterior mediastinum. No mediastinal hematoma. 3. Small right minimal left pleural effusions associated with the pendant lower lobe atelectasis.   Electronically Signed   By: Lajean Manes M.D.   On: 08/27/2019 16:03  *INTRAOPERATIVE TRANSESOPHAGEAL REPORT *      Patient Name:  BORYS MAROLDA Date of Exam: 08/23/2019  Medical Rec #: [id number]   Height:    72.0 in  Accession #:  [id number]   Weight:    177.2 lb  Date of Birth: [birth year]   BSA:     2.02 m  Patient Age:  46 years    BP:      115/78 mmHg  Patient Gender: M       HR:      76 bpm.  Exam Location: Anesthesiology   Transesophogeal exam was perform intraoperatively during surgical  procedure.  Patient was closely monitored under general anesthesia during the entirety  of  examination.   Indications:   Aortic Dissection  Sonographer:   Raquel Sarna Senior RDCS  Performing Phys: [id number]  Diagnosing Phys: Lillia Abed MD   Complications: No known complications during this procedure.  POST-OP IMPRESSIONS  - Left Ventricle: The left ventricle is unchanged from pre-bypass.  - Aorta: there is no dissection present in the aorta A graft was placed in  the  ascending aorta for repair.  - Aortic Valve: The aortic valve  appears unchanged from pre-bypass.  - Mitral Valve: There is mild regurgitation.S/P mitral Valve repair.  Alfieri  stitch A2 to P2.  - Tricuspid Valve: The tricuspid valve appears unchanged from pre-bypass.   PRE-OP FINDINGS  Left Ventricle: The left ventricle has normal systolic function, with an  ejection fraction of 60-65%. The cavity size was normal. There is mild  concentric left ventricular hypertrophy.   Right Ventricle: The right ventricle has normal systolic function. The  cavity was normal. There is no increase in right ventricular wall  thickness.   Left Atrium: Left atrial size was normal in size. The left atrial  appendage is well visualized and there is no evidence of thrombus present.     Interatrial Septum: No atrial level shunt detected by color flow Doppler.   Pericardium: There is no evidence of pericardial effusion.   Mitral Valve: The mitral valve is normal in structure. Mild thickening of  the mitral valve leaflet. Mitral valve regurgitation is severe by color  flow Doppler. The MR jet is posteriorly-directed.   Tricuspid Valve: The tricuspid valve was normal in structure. Tricuspid  valve regurgitation was not visualized by color flow Doppler.   Aortic Valve: The aortic valve is normal in structure. Aortic valve  regurgitation was not visualized by color flow Doppler. There is no  stenosis of the aortic valve.   Pulmonic Valve: The pulmonic valve was not assessed.  Pulmonic valve regurgitation is not visualized by color flow Doppler.    Aorta: There is evidence of a dissection in the aortic root, ascending  aorta and aortic arch. The dissection entry point is at the aortic arch  and the exit point appears to be at the at the ascending aorta.     Lillia Abed MD  Electronically signed by Lillia Abed MD  Signature Date/Time: 08/26/2019/9:39:53 AM     Treatments:         -- CARDIOTHORACIC SURGERY OPERATIVE NOTE  Date of Procedure:     08/23/2019  Preoperative Diagnosis:      Stanford Type A Aortic Dissection  Postoperative Diagnosis:    Same with severe Mitral Valve Regurgitation  Procedure:       Repair of Type A aortic dissection with proximal supra-coronary graft implantation (30 mm Dacron) and aortic valve resuspension and distal extended hemiarch reconstruction with beveled 30 mm Dacron graft under moderate hypothermic circulatory arrest with antegrade cerebral perfusion  Mitral valve repair (Alfieri edge-to-edge approximation of A2 scallop to P2 scallop)    Right axillary artery cannulation; TEE  Surgeon:        B. Murvin Natal, MD  Assistant:       Macarthur Critchley PA-C  Anesthesia:    get  Operative Findings: ? Preserved bi-ventricular systolic function ? Minimal aortic insufficiency prior to repair; no AI after repair ? Severe mitral regurgitation preoperatively due to elongated A2 Chordae; 1+ MR after repair ? Diffuse coagulopathy associated with hypothermic circulatory arrest    BRIEF CLINICAL NOTE AND INDICATIONS FOR SURGERY  69 yo man experienced sudden onset chest pain the evening of 08/22/2019.  This was so severe that it prompted emergency department presentation at [facility].  Evaluation ultimately demonstrated type a aortic dissection.  He was transferred to [facility] for urgent repair.  Preoperatively, the patient is neurologically intact and shows no signs of heart failure or malperfusion.          Discharge Exam: Blood pressure 110/76, pulse 86, temperature 98.2 F (36.8 C), temperature source Oral, resp. rate 17, height 6' (1.829 m), weight 78.4 kg, SpO2 90 %.  General appearance: alert, cooperative and no distress Neurologic: intact Heart: regular rate and rhythm Lungs: Breath sounds are clear Abdomen: soft and non-tender Extremities: All well perfused, palpable distal pulses Wound: The sternal and right subclavian incisions are intact and healing with no  sign of complication.   Disposition:    Allergies as of 08/31/2019   No Known Allergies     Medication List    STOP taking these medications   ZINC PO     TAKE these medications   amiodarone 200 MG tablet Commonly known as: PACERONE Take 1 tablet (200 mg total) by mouth daily.   aspirin EC 81 MG tablet Take 81 mg by mouth daily.   CALCIUM PO Take 1 tablet by mouth daily.   captopril 12.5 MG tablet Commonly known as: CAPOTEN Take 0.5 tablets (6.25 mg total) by mouth 3 (three) times daily.   colchicine 0.6 MG tablet Take 1 tablet (0.6 mg total) by mouth daily.   furosemide 40 MG tablet Commonly known as: Lasix Take 1 tablet (40 mg total) by mouth daily.   multivitamin with minerals Tabs tablet Take 1 tablet by mouth daily.   Potassium Chloride ER 20 MEQ Tbcr Take 20 mEq by mouth daily.   SAW PALMETTO PO Take 1 capsule by mouth daily.   traMADol 50 MG tablet Commonly known as: ULTRAM Take 1 tablet (50 mg total) by mouth every 4 (four) hours as needed for moderate pain.   TUMS PO Take 1 tablet by mouth daily as needed (acid reflux/indigestion).   VITAMIN D3 PO  Take 1 tablet by mouth daily.      Follow-up Information    Wonda Olds, MD. Go on 09/07/2019.   Specialty: Cardiothoracic Surgery Why: You have an appointment with Dr. Orvan Seen on Monday, 09/07/19 at 12:30pm. Contact information: [address] [place] [address] [phone]        Satira Sark, MD. Go on 09/14/2019.   Specialty: Cardiology Why: You have a cardiology follow up appointment with Dr. Domenic Polite in [place] on Monday 09/14/19 at 1:20pm.  Contact information: [place] [address] [phone]          The patient has been discharged on:   1.Beta Blocker:  Yes [ x  ]                              No   [   ]                              If No, reason:  2.Ace Inhibitor/ARB: Yes [ x  ]                                     No  [    ]                                      If No, reason:  3.Statin:   Yes [   ]                  No  [ x  ]                  If No, reason: no known coronary artery disease  4.Shela CommonsLauro Regulus   ]                  No   [   ]                  If No, reason:    Signed: Antony Odea, PA-C 08/31/2019, 8:14 AM

## 2019-08-26 NOTE — Discharge Instructions (Signed)

## 2019-08-26 NOTE — Plan of Care (Signed)

## 2019-08-26 NOTE — Progress Notes (Addendum)
CARDIAC REHAB PHASE I   PRE:  Rate/Rhythm: 77 SR vs ?JR  BP:  Supine:   Sitting: 111/74  Standing:    SaO2: 99% 4L  MODE:  Ambulation: 80 ft   POST:  Rate/Rhythm: 89 SR vs ?JR  BP:  Supine:   Sitting: 97/63  Standing:    SaO2: 97% 4L 1408-1440 Pt able to walk 80 ft with EVA, gait belt use, and asst x 2 on 4L.. One asst followed with recliner. Pt denied dizziness and did not need to sit but tired by end of walk. To bed after walk. Wife in room.   [name], RN BSN  08/26/2019 2:33 PM

## 2019-08-27 ENCOUNTER — Inpatient Hospital Stay (HOSPITAL_COMMUNITY): Payer: Medicare Other

## 2019-08-27 HISTORY — PX: IR THORACENTESIS ASP PLEURAL SPACE W/IMG GUIDE: IMG5380

## 2019-08-27 LAB — CBC
HCT: 23.2 % — ABNORMAL LOW (ref 39.0–52.0)
Hemoglobin: 7.9 g/dL — ABNORMAL LOW (ref 13.0–17.0)
MCH: 31.5 pg (ref 26.0–34.0)
MCHC: 34.1 g/dL (ref 30.0–36.0)
MCV: 92.4 fL (ref 80.0–100.0)
Platelets: 91 10*3/uL — ABNORMAL LOW (ref 150–400)
RBC: 2.51 MIL/uL — ABNORMAL LOW (ref 4.22–5.81)
RDW: 15.2 % (ref 11.5–15.5)
WBC: 8.2 10*3/uL (ref 4.0–10.5)
nRBC: 0.7 % — ABNORMAL HIGH (ref 0.0–0.2)

## 2019-08-27 LAB — BASIC METABOLIC PANEL
Anion gap: 11 (ref 5–15)
BUN: 11 mg/dL (ref 8–23)
CO2: 27 mmol/L (ref 22–32)
Calcium: 7.8 mg/dL — ABNORMAL LOW (ref 8.9–10.3)
Chloride: 101 mmol/L (ref 98–111)
Creatinine, Ser: 0.86 mg/dL (ref 0.61–1.24)
GFR calc Af Amer: >60 mL/min (ref >60)
GFR calc non Af Amer: >60 mL/min (ref >60)
Glucose, Bld: 100 mg/dL — ABNORMAL HIGH (ref 70–99)
Potassium: 3.2 mmol/L — ABNORMAL LOW (ref 3.5–5.1)
Sodium: 139 mmol/L (ref 135–145)

## 2019-08-27 LAB — GLUCOSE, CAPILLARY
Glucose-Capillary: 101 mg/dL — ABNORMAL HIGH (ref 70–99)
Glucose-Capillary: 92 mg/dL (ref 70–99)
Glucose-Capillary: 110 mg/dL — ABNORMAL HIGH (ref 70–99)
Glucose-Capillary: 107 mg/dL — ABNORMAL HIGH (ref 70–99)

## 2019-08-27 MED ORDER — IOHEXOL 350 MG/ML SOLN
80.0000 mL | Freq: Once | INTRAVENOUS | Status: AC | PRN
  Administered 2019-08-27: 80 mL via INTRAVENOUS

## 2019-08-27 MED ORDER — FUROSEMIDE 40 MG PO TABS
40.0000 mg | ORAL_TABLET | Freq: Two times a day (BID) | ORAL | Status: DC
  Administered 2019-08-27 (×2): 40 mg via ORAL
  Filled 2019-08-27 (×2): qty 1

## 2019-08-27 MED ORDER — POTASSIUM CHLORIDE CRYS ER 20 MEQ PO TBCR: 20.0000 meq | EXTENDED_RELEASE_TABLET | Freq: Two times a day (BID) | ORAL | Status: DC

## 2019-08-27 MED ORDER — POTASSIUM CHLORIDE CRYS ER 20 MEQ PO TBCR
30.0000 meq | EXTENDED_RELEASE_TABLET | ORAL | Status: AC
  Administered 2019-08-27 (×3): 30 meq via ORAL
  Filled 2019-08-27 (×3): qty 1

## 2019-08-27 MED ORDER — LIDOCAINE HCL 1 % IJ SOLN
INTRAMUSCULAR | Status: AC
  Filled 2019-08-27: qty 20

## 2019-08-27 NOTE — Progress Notes (Signed)
CARDIAC REHAB PHASE I   PRE:  Rate/Rhythm: 77 SR  BP:  Supine:   Sitting: 122/72  Standing:    SaO2: 98% 3L  MODE:  Ambulation: 120 ft   POST:  Rate/Rhythm: 86 SR  BP:  Supine:   Sitting: 130/83  Standing:    SaO2: 100% 2L 1302-1335 Pt up in chair. Stated he walked this morning but willing to walk again. Pt walked 120 ft on 2L with gait belt use, rolling walker and asst x 1 with steady gait. Tired by end of walk but tolerated well. Left on 2L. Pt would like rollator for home use. Would recommend to assist with building up endurance since he could walk and then sit to rest and then walk more,   [name], RN BSN  08/27/2019 1:31 PM

## 2019-08-27 NOTE — Plan of Care (Signed)

## 2019-08-27 NOTE — Progress Notes (Signed)
4 Days Post-Op Procedure(s) (LRB): REPAIR OF TYPE A AORTIC DISSECTION USING HEMASHIELF PLATINUM 30MM VASCULAR GRAFT (N/A) Mitral Valve Repair (Mvr) Subjective: Resting in bed, feels he is making progress.  No new concerns.  O2 a 3L/[place].  Objective: Vital signs in last 24 hours: Temp:  [98.2 F (36.8 C)-99.6 F (37.6 C)] 98.5 F (36.9 C) (02/11 0738) Pulse Rate:  [64-150] 71 (02/11 0738) Cardiac Rhythm: Normal sinus rhythm (02/11 0738) Resp:  [17-30] 25 (02/11 0738) BP: (102-120)/(63-87) 120/81 (02/11 0738) SpO2:  [79 %-99 %] 94 % (02/11 0738) Weight:  [85.8 kg] 85.8 kg (02/11 0343)     Intake/Output from previous day: 02/10 0701 - 02/11 0700 In: 666.6 [P.O.:600; I.V.:66.6] Out: 2292 [Urine:2280; [place]; Chest Tube:10] Intake/Output this shift: Total I/O In: 240 [P.O.:240] Out: 200 [Urine:200]  Physical Exam: General appearance: alert, cooperative and no distress Neurologic: intact Heart: regular rate and rhythm Lungs: Breath sounds clear. CXR shows small to moderate left pleural effusion. Abdomen: Soft and NT.  Extremities: All well perfused with palpable distal LE pulses, still has some edema in hands and LE's.  Lab Results: Recent Labs    08/26/19 0500 08/27/19 0317  WBC 9.4 8.2  HGB 8.1* 7.9*  HCT 24.6* 23.2*  PLT 79* 91*   BMET:  Recent Labs    08/26/19 0500 08/27/19 0317  NA 137 139  K 3.2* 3.2*  CL 102 101  CO2 27 27  GLUCOSE 95 100*  BUN 14 11  CREATININE 0.92 0.86  CALCIUM 7.6* 7.8*    PT/INR: No results for input(s): LABPROT, INR in the last 72 hours. ABG    Component Value Date/Time   PHART 7.402 08/23/2019 2033   HCO3 18.8 (L) 08/23/2019 2033   TCO2 20 (L) 08/23/2019 2033   ACIDBASEDEF 5.0 (H) 08/23/2019 2033   O2SAT 98.0 08/23/2019 2033   CBG (last 3)  Recent Labs    08/26/19 1930 08/26/19 2349 08/27/19 0345  GLUCAP 104* 101* 101*    Assessment/Plan: S/P Procedure(s) (LRB): REPAIR OF TYPE A AORTIC DISSECTION USING  HEMASHIELF PLATINUM 30MM VASCULAR GRAFT (N/A) Mitral Valve Repair (Mvr)  -POD4 repair Type A aortic dissection with a straight ascending aortic graft and MV repair for MR. Hemodynamics stable.  Advance activity. CTA of thoracic aorta today to evaluate repair and distal anatomy.   -Volume excess- Lasix drip discontinued yesterday. Wt still 6kg above pre-op. Resume Lasix orally 40mg  BID.   -Hypokalemia- replacement ordered, monitor.   -Expected acute blood loss anemia- Transfused early post-op- Hct is stable with no indication for further transfusion now.   -Thrombocytopenia-Plt count trending up. Monitor.   -Left pleural effusion- will request IR U/S guided thoracentesis.    LOS: 4 days    Antony Odea, PA-C [phone] 08/27/2019

## 2019-08-27 NOTE — Procedures (Signed)
PROCEDURE SUMMARY:  Successful US guided left thoracentesis. Yielded 400 mL of dark bloddy fluid. Pt tolerated procedure well. No immediate complications.  Specimen was not sent for labs. CXR ordered.  EBL < 5 mL  Ascencion Dike PA-C 08/27/2019 11:59 AM

## 2019-08-28 LAB — CBC
HCT: 23.3 % — ABNORMAL LOW (ref 39.0–52.0)
Hemoglobin: 7.6 g/dL — ABNORMAL LOW (ref 13.0–17.0)
MCH: 31.1 pg (ref 26.0–34.0)
MCHC: 32.6 g/dL (ref 30.0–36.0)
MCV: 95.5 fL (ref 80.0–100.0)
Platelets: 118 10*3/uL — ABNORMAL LOW (ref 150–400)
RBC: 2.44 MIL/uL — ABNORMAL LOW (ref 4.22–5.81)
RDW: 15.7 % — ABNORMAL HIGH (ref 11.5–15.5)
WBC: 7.8 10*3/uL (ref 4.0–10.5)
nRBC: 0.5 % — ABNORMAL HIGH (ref 0.0–0.2)

## 2019-08-28 LAB — GLUCOSE, CAPILLARY
Glucose-Capillary: 102 mg/dL — ABNORMAL HIGH (ref 70–99)
Glucose-Capillary: 103 mg/dL — ABNORMAL HIGH (ref 70–99)
Glucose-Capillary: 93 mg/dL (ref 70–99)
Glucose-Capillary: 99 mg/dL (ref 70–99)

## 2019-08-28 LAB — BASIC METABOLIC PANEL
Anion gap: 6 (ref 5–15)
Anion gap: 11 (ref 5–15)
BUN: 11 mg/dL (ref 8–23)
BUN: 12 mg/dL (ref 8–23)
CO2: 25 mmol/L (ref 22–32)
CO2: 27 mmol/L (ref 22–32)
Calcium: 7.7 mg/dL — ABNORMAL LOW (ref 8.9–10.3)
Calcium: 8.1 mg/dL — ABNORMAL LOW (ref 8.9–10.3)
Chloride: 107 mmol/L (ref 98–111)
Chloride: 102 mmol/L (ref 98–111)
Creatinine, Ser: 0.94 mg/dL (ref 0.61–1.24)
Creatinine, Ser: 0.98 mg/dL (ref 0.61–1.24)
GFR calc Af Amer: >60 mL/min (ref >60)
GFR calc Af Amer: >60 mL/min (ref >60)
GFR calc non Af Amer: >60 mL/min (ref >60)
GFR calc non Af Amer: >60 mL/min (ref >60)
Glucose, Bld: 97 mg/dL (ref 70–99)
Glucose, Bld: 106 mg/dL — ABNORMAL HIGH (ref 70–99)
Potassium: 3.9 mmol/L (ref 3.5–5.1)
Potassium: 3.7 mmol/L (ref 3.5–5.1)
Sodium: 138 mmol/L (ref 135–145)
Sodium: 140 mmol/L (ref 135–145)

## 2019-08-28 MED ORDER — FUROSEMIDE 10 MG/ML IJ SOLN
60.0000 mg | Freq: Two times a day (BID) | INTRAMUSCULAR | Status: DC
  Administered 2019-08-28 – 2019-08-29 (×4): 60 mg via INTRAVENOUS
  Filled 2019-08-28 (×4): qty 6

## 2019-08-28 MED ORDER — POTASSIUM CHLORIDE CRYS ER 20 MEQ PO TBCR
20.0000 meq | EXTENDED_RELEASE_TABLET | Freq: Three times a day (TID) | ORAL | Status: AC
  Administered 2019-08-28 (×3): 20 meq via ORAL
  Filled 2019-08-28 (×3): qty 1

## 2019-08-28 NOTE — Progress Notes (Signed)
CARDIAC REHAB PHASE I   PRE:  Rate/Rhythm: 43 SR  BP:  Supine:   Sitting: 125/79  Standing:    SaO2: 92%RA  MODE:  Ambulation: 340 ft   POST:  Rate/Rhythm: 96 SR  BP:  Supine:   Sitting: 127/74  Standing:    SaO2: 90-96%RA during walk 1010-1110 Took pt off oxygen while I educated him and wife and monitored sats.  92%.  Reviewed sternal precautions, staying in the tube, IS, walking for exercise and CRP 2. Will send letter of interest to [place] CRP 2.  Walked pt and monitored sats. He maintained above 90% RA. Encouraged pursed lip breathing. Left off oxygen and notified staff. Pt to call staff if he feels SOB. Wrote down how to view discharge video. Case manager aware pt would like rollator for home use. Pt able to walk 340 ft on RA with rolling walker. Second walk today.   [name], RN BSN  08/28/2019 11:03 AM

## 2019-08-28 NOTE — Progress Notes (Addendum)
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 He notes improved inspiration since thoracentesis. He is progressing with ambulation.  Remains on 3Lnc O2.   Objective: Vital signs in last 24 hours: Temp:  [97.9 F (36.6 C)-98.8 F (37.1 C)] 98.2 F (36.8 C) (02/12 0326) Pulse Rate:  [71-77] 76 (02/11 1929) Cardiac Rhythm: Normal sinus rhythm (02/12 0330) Resp:  [13-27] 17 (02/12 0326) BP: (107-128)/(69-85) 128/85 (02/12 0326) SpO2:  [94 %-98 %] 98 % (02/11 1929) Weight:  [84.3 kg] 84.3 kg (02/12 0556)   Intake/Output from previous day: 02/11 0701 - 02/12 0700 In: 720 [P.O.:720] Out: 2000 [Urine:2000] Intake/Output this shift: No intake/output data recorded.   Physical Exam: General appearance:alert, cooperative and no distress Neurologic:intact Heart:regular rate and rhythm Lungs:Breath sounds clear. CXR shows near complete resolution of left pleural effusion. Abdomen:Soft and NT. Extremities:All well perfused with palpable distal LE pulses,  edema in hands and LE's is receding.   Lab Results: Recent Labs    08/27/19 0317 08/28/19 0126  WBC 8.2 7.8  HGB 7.9* 7.6*  HCT 23.2* 23.3*  PLT 91* 118*   BMET:  Recent Labs    08/27/19 0317 08/28/19 0126  NA 139 138  K 3.2* 3.9  CL 101 107  CO2 27 25  GLUCOSE 100* 97  BUN 11 11  CREATININE 0.86 0.94  CALCIUM 7.8* 7.7*    PT/INR: No results for input(s): LABPROT, INR in the last 72 hours. ABG    Component Value Date/Time   PHART 7.402 08/23/2019 2033   HCO3 18.8 (L) 08/23/2019 2033   TCO2 20 (L) 08/23/2019 2033   ACIDBASEDEF 5.0 (H) 08/23/2019 2033   O2SAT 98.0 08/23/2019 2033   CBG (last 3)  Recent Labs    08/27/19 1927 08/28/19 0002 08/28/19 0325  GLUCAP 107* 102* 93     CT ANGIOGRAPHY CHEST WITH CONTRAST   08/27/19  TECHNIQUE: Multidetector CT imaging of the chest was performed using the standard protocol during bolus administration of intravenous contrast. Multiplanar CT image reconstructions and MIPs were obtained to evaluate the vascular anatomy.  CONTRAST:  40mL OMNIPAQUE IOHEXOL 350 MG/ML SOLN  COMPARISON:  08/23/2019.  FINDINGS: Cardiovascular: Since the prior CT scan, the aortic dissection has been repaired. There is no residual dissection of the ascending aorta. Ascending aorta is normal in caliber, 3 cm in diameter. There is low attenuation along the margin of the thoracic aorta from the distal arch through the descending portion consistent with edema. Descending thoracic aorta is normal in caliber, without atherosclerotic change. Aortic arch branch vessels are widely patent, without dissection.  Heart is top-normal in size. There is pericardial fluid or edema. Three-vessel coronary artery calcifications are noted.  Central pulmonary arteries are unremarkable.  Mediastinum/Nodes: There is postsurgical edema along the anterior mediastinum without a defined hematoma or mass. No mediastinal or hilar adenopathy. Trachea is widely patent. Esophagus is unremarkable.  Lungs/Pleura: Small right and minimal left pleural effusions. There is dependent opacity in both lower lobes consistent with atelectasis. Minor linear atelectasis is noted at the bases of the right middle lobe and left upper lobe lingula. Remainder of the lungs is clear. No pulmonary edema. No pneumothorax.  Upper Abdomen: Limited visualization of the upper abdomen shows no acute or significant abnormalities.  Musculoskeletal: No fracture or acute finding.  No bone lesion.  Review of the MIP images confirms the above  findings.  IMPRESSION: 1. Status postoperative repair of an aortic dissection. There is no residual dissection or evidence of a complication of the repair. The ascending aorta,  arch and descending aorta are normal in caliber. 2. Small amount of pericardial edema/fluid. Postoperative edema noted along the anterior mediastinum. No mediastinal hematoma. 3. Small right minimal left pleural effusions associated with the pendant lower lobe atelectasis.   Electronically Signed   By: Lajean Manes M.D.   On: 08/27/2019 16:03  Assessment/Plan: S/P Procedure(s) (LRB): REPAIR OF TYPE A AORTIC DISSECTION USING HEMASHIELF PLATINUM 30MM VASCULAR GRAFT (N/A) Mitral Valve Repair (Mvr)    LOS: 5 days   -POD5 repair Type A aortic dissection with a straight ascending aortic graft and MV repair for MR. Hemodynamics stable.  CTA of thoracic aorta yesterday shows no complication of the repair and no distal aortic abnormality. BP control is acceptable.   -Volume excess-  Improving with diuresis. Wt ~5kg above pre-op. Continue Lasix orally 40mg  BID.   -Hypokalemia- corrected,  monitor.   -Expected acute blood loss anemia- Transfused early post-op- Hct has trended down over the past several days but is stable now.  No indication for further transfusion now.   -Thrombocytopenia-Plt count trending up. Monitor.  -Pulmonary: Left pleural effusion- POD1  U/S guided thoracentesis by IR with 421ml dark bloody fluid evacuated. Still has some basilar ATX and is requiring supplemental O2.  Encouraging pulmonary hygiene, ambulation.   -Disposition- Overall making satisfactory progress. Planning eventual discharge to home 1-2 days when respiratory parameters stable off O2.   Antony Odea, PA-C [phone] 08/28/2019 Agree with assessment and plan. Diuresis for another day; may be able to discharge Sat. Kacper Cartlidge Z. Orvan Seen, [place]

## 2019-08-29 LAB — BASIC METABOLIC PANEL
Anion gap: 6 (ref 5–15)
BUN: 12 mg/dL (ref 8–23)
CO2: 26 mmol/L (ref 22–32)
Calcium: 7.9 mg/dL — ABNORMAL LOW (ref 8.9–10.3)
Chloride: 106 mmol/L (ref 98–111)
Creatinine, Ser: 0.95 mg/dL (ref 0.61–1.24)
GFR calc Af Amer: >60 mL/min (ref >60)
GFR calc non Af Amer: >60 mL/min (ref >60)
Glucose, Bld: 113 mg/dL — ABNORMAL HIGH (ref 70–99)
Potassium: 3.6 mmol/L (ref 3.5–5.1)
Sodium: 138 mmol/L (ref 135–145)

## 2019-08-29 LAB — CBC
HCT: 24.1 % — ABNORMAL LOW (ref 39.0–52.0)
Hemoglobin: 8 g/dL — ABNORMAL LOW (ref 13.0–17.0)
MCH: 31.4 pg (ref 26.0–34.0)
MCHC: 33.2 g/dL (ref 30.0–36.0)
MCV: 94.5 fL (ref 80.0–100.0)
Platelets: 164 10*3/uL (ref 150–400)
RBC: 2.55 MIL/uL — ABNORMAL LOW (ref 4.22–5.81)
RDW: 15.5 % (ref 11.5–15.5)
WBC: 8.4 10*3/uL (ref 4.0–10.5)
nRBC: 0.4 % — ABNORMAL HIGH (ref 0.0–0.2)

## 2019-08-29 MED ORDER — POTASSIUM CHLORIDE CRYS ER 20 MEQ PO TBCR
30.0000 meq | EXTENDED_RELEASE_TABLET | Freq: Three times a day (TID) | ORAL | Status: AC
  Administered 2019-08-29 (×3): 30 meq via ORAL
  Filled 2019-08-29 (×3): qty 1

## 2019-08-29 NOTE — Progress Notes (Addendum)
      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  Objective: Vital signs in last 24 hours: Temp:  [98 F (36.7 C)-98.8 F (37.1 C)] 98.4 F (36.9 C) (02/13 0441) Pulse Rate:  [80-87] 80 (02/13 0441) Cardiac Rhythm: Normal sinus rhythm (02/13 0700) Resp:  [19-24] 20 (02/13 0441) BP: (101-133)/(70-78) 109/78 (02/13 0441) SpO2:  [90 %-94 %] 93 % (02/13 0441) Weight:  [84.6 kg] 84.6 kg (02/13 0441)  Pre op weight 79.8 kg Current Weight  08/29/19 84.6 kg       Intake/Output from previous day: 02/12 0701 - 02/13 0700 In: 630 [P.O.:630] Out: 2200 [Urine:2200]   Physical Exam:  Cardiovascular: RRR Pulmonary: Mostly clear Abdomen: Soft, non tender, bowel sounds present. Extremities:Trace bilateral lower extremity edema. Palpable LE pulses Wounds: Clean and dry.  No erythema or signs of infection.  Lab Results: CBC: Recent Labs    08/28/19 0126 08/29/19 0230  WBC 7.8 8.4  HGB 7.6* 8.0*  HCT 23.3* 24.1*  PLT 118* 164   BMET:  Recent Labs    08/28/19 1550 08/29/19 0230  NA 140 138  K 3.7 3.6  CL 102 106  CO2 27 26  GLUCOSE 106* 113*  BUN 12 12  CREATININE 0.98 0.95  CALCIUM 8.1* 7.9*    PT/INR:  Lab Results  Component Value Date   INR 1.8 (H) 08/23/2019   INR 1.0 08/22/2019   ABG:  INR: Will add last result for INR, ABG once components are confirmed Will add last 4 CBG results once components are confirmed  Assessment/Plan:  1. CV - HR in the 80's this am. On Amiodarone 200 mg daily, Captopril 6.25 mg tid. 2.  Pulmonary - S/p left thoracentesis on 02/11. On 2 liters of oxygen via . Wean as able. Check CXR in am. 3. Volume Overload - On Lasix 60 mg IV bid. Likely give today and then change to  oral in am 4.  Acute blood loss anemia - H and H this am 8 and 24.1 5. Supplement potassium 6. Possible discharge in am  Sharalyn Ink [facility] 08/29/2019,8:24 AM  Patient progressing after emergency repair of type a dissection and appears to be close to discharge.  Ambulating with physical therapy off O2 We will continue Lasix, [weight is up 10 pounds ]check x-ray in a.m. check for left pleural effusion status post thoracentesis.  patient examined and medical record reviewed,agree with above note. Tharon Aquas Trigt III 08/29/2019

## 2019-08-29 NOTE — Progress Notes (Signed)
CARDIAC REHAB PHASE I   PRE:  Rate/Rhythm: 83 SR  BP:  Supine:   Sitting: 132/84   Standing:    SaO2: 91% RA  MODE:  Ambulation: 400 ft   POST:  Rate/Rhythm: 96 SR  BP:  Supine:   Sitting: 133/79  Standing:    SaO2: 94% RA Tolerated very well with assistance x 1 and rolling walker.  Oxygen saturations great on RA.  Patient reports having a "choking sensation" at times and he feels as if he is not able to get air into his lungs.  Able to demonstrate IS and purse lip breathing. Placed back in chair upon return to room. [id number] Liliane Channel RN, BSN 08/29/2019 9:23 AM

## 2019-08-30 ENCOUNTER — Inpatient Hospital Stay (HOSPITAL_COMMUNITY): Payer: Medicare Other

## 2019-08-30 LAB — BASIC METABOLIC PANEL
Anion gap: 10 (ref 5–15)
BUN: 15 mg/dL (ref 8–23)
CO2: 26 mmol/L (ref 22–32)
Calcium: 8.5 mg/dL — ABNORMAL LOW (ref 8.9–10.3)
Chloride: 102 mmol/L (ref 98–111)
Creatinine, Ser: 1.02 mg/dL (ref 0.61–1.24)
GFR calc Af Amer: >60 mL/min (ref >60)
GFR calc non Af Amer: >60 mL/min (ref >60)
Glucose, Bld: 110 mg/dL — ABNORMAL HIGH (ref 70–99)
Potassium: 4.3 mmol/L (ref 3.5–5.1)
Sodium: 138 mmol/L (ref 135–145)

## 2019-08-30 MED ORDER — POTASSIUM CHLORIDE CRYS ER 20 MEQ PO TBCR: 20.0000 meq | EXTENDED_RELEASE_TABLET | Freq: Two times a day (BID) | ORAL | Status: DC

## 2019-08-30 MED ORDER — METOLAZONE 5 MG PO TABS
5.0000 mg | ORAL_TABLET | Freq: Every day | ORAL | Status: AC
  Administered 2019-08-30 – 2019-08-31 (×2): 5 mg via ORAL
  Filled 2019-08-30 (×2): qty 1

## 2019-08-30 MED ORDER — ACETAMINOPHEN 160 MG/5ML PO SOLN
325.0000 mg | ORAL | Status: DC | PRN
  Administered 2019-08-30 – 2019-08-31 (×3): 325 mg via ORAL
  Filled 2019-08-30 (×4): qty 20.3

## 2019-08-30 MED ORDER — FUROSEMIDE 10 MG/ML IJ SOLN
40.0000 mg | Freq: Every day | INTRAMUSCULAR | Status: DC
  Administered 2019-09-01: 40 mg via INTRAVENOUS
  Filled 2019-08-30: qty 4

## 2019-08-30 MED ORDER — FE FUMARATE-B12-VIT C-FA-IFC PO CAPS
1.0000 | ORAL_CAPSULE | Freq: Three times a day (TID) | ORAL | Status: DC
  Administered 2019-08-30 – 2019-09-01 (×7): 1 via ORAL
  Filled 2019-08-30 (×9): qty 1

## 2019-08-30 MED ORDER — FUROSEMIDE 10 MG/ML IJ SOLN
40.0000 mg | Freq: Two times a day (BID) | INTRAMUSCULAR | Status: DC
  Administered 2019-08-30: 40 mg via INTRAVENOUS

## 2019-08-30 MED ORDER — POTASSIUM CHLORIDE CRYS ER 20 MEQ PO TBCR
20.0000 meq | EXTENDED_RELEASE_TABLET | Freq: Every day | ORAL | Status: DC
  Administered 2019-08-30 – 2019-09-01 (×3): 20 meq via ORAL
  Filled 2019-08-30 (×3): qty 1

## 2019-08-30 MED ORDER — FUROSEMIDE 10 MG/ML IJ SOLN: 60.0000 mg | Freq: Every day | INTRAMUSCULAR | Status: DC

## 2019-08-30 NOTE — Progress Notes (Addendum)
      [address]       [place] [address]             [phone]        7 Days Post-Op Procedure(s) (LRB): REPAIR OF TYPE A AORTIC DISSECTION USING HEMASHIELF PLATINUM 30MM VASCULAR GRAFT (N/A) Mitral Valve Repair (Mvr)  Subjective: Patient without specific complaints this am.  Objective: Vital signs in last 24 hours: Temp:  [98.2 F (36.8 C)-99.5 F (37.5 C)] 98.2 F (36.8 C) (02/14 0421) Pulse Rate:  [83-91] 83 (02/14 0421) Cardiac Rhythm: Normal sinus rhythm (02/14 0700) Resp:  [12-29] 23 (02/13 1945) BP: (98-120)/(66-72) 105/70 (02/14 0421) SpO2:  [89 %-91 %] 90 % (02/14 0421)  Pre op weight 79.8 kg Current Weight  08/29/19 84.6 kg       Intake/Output from previous day: 02/13 0701 - 02/14 0700 In: 790 [P.O.:790] Out: 3625 [Urine:3625]   Physical Exam:  Cardiovascular: RRR Pulmonary: Mostly clear Abdomen: Soft, non tender, bowel sounds present. Extremities:Trace bilateral lower extremity edema. Palpable LE pulses Wounds: Clean and dry.  No erythema or signs of infection.  Lab Results: CBC: Recent Labs    08/28/19 0126 08/29/19 0230  WBC 7.8 8.4  HGB 7.6* 8.0*  HCT 23.3* 24.1*  PLT 118* 164   BMET:  Recent Labs    08/29/19 0230 08/30/19 0103  NA 138 138  K 3.6 4.3  CL 106 102  CO2 26 26  GLUCOSE 113* 110*  BUN 12 15  CREATININE 0.95 1.02  CALCIUM 7.9* 8.5*    PT/INR:  Lab Results  Component Value Date   INR 1.8 (H) 08/23/2019   INR 1.0 08/22/2019   ABG:  INR: Will add last result for INR, ABG once components are confirmed Will add last 4 CBG results once components are confirmed  Assessment/Plan:  1. CV - HR in the 80's this am. On Amiodarone 200 mg daily, Captopril 6.25 mg tid. 2.  Pulmonary - S/p left thoracentesis on 02/11. On room air. CXR this am appears to show left pleural effusion, no pneumohtorax. Encourage incentive spirometer 3. Volume Overload - On Lasix 60 mg IV bid and given Metolzaone bid  started yesterday. Will decrease Lasix to 40 mg IV  this am. Likely change to oral Lasix in am. 4.  Acute blood loss anemia - H and H this am 8 and 24.1 6. Remove foley 7. Patient does not have power at home. He still needs more diuresis. Possible discharge in am.  Donielle M ZimmermanPA-C 08/30/2019,8:08 AM  CXR- good Voided with foley out Weaned off O2 Will DC tomorrow- patient currently has no power at home patient examined and medical record reviewed,agree with above note. Tharon Aquas Trigt III 08/30/2019

## 2019-08-31 ENCOUNTER — Inpatient Hospital Stay (HOSPITAL_COMMUNITY): Payer: Medicare Other

## 2019-08-31 LAB — BASIC METABOLIC PANEL
Anion gap: 10 (ref 5–15)
BUN: 17 mg/dL (ref 8–23)
CO2: 23 mmol/L (ref 22–32)
Calcium: 8.6 mg/dL — ABNORMAL LOW (ref 8.9–10.3)
Chloride: 100 mmol/L (ref 98–111)
Creatinine, Ser: 1.07 mg/dL (ref 0.61–1.24)
GFR calc Af Amer: >60 mL/min (ref >60)
GFR calc non Af Amer: >60 mL/min (ref >60)
Glucose, Bld: 115 mg/dL — ABNORMAL HIGH (ref 70–99)
Potassium: 4.2 mmol/L (ref 3.5–5.1)
Sodium: 133 mmol/L — ABNORMAL LOW (ref 135–145)

## 2019-08-31 LAB — CBC
HCT: 28.1 % — ABNORMAL LOW (ref 39.0–52.0)
Hemoglobin: 9.1 g/dL — ABNORMAL LOW (ref 13.0–17.0)
MCH: 30.4 pg (ref 26.0–34.0)
MCHC: 32.4 g/dL (ref 30.0–36.0)
MCV: 94 fL (ref 80.0–100.0)
Platelets: 328 10*3/uL (ref 150–400)
RBC: 2.99 MIL/uL — ABNORMAL LOW (ref 4.22–5.81)
RDW: 15.6 % — ABNORMAL HIGH (ref 11.5–15.5)
WBC: 10.9 10*3/uL — ABNORMAL HIGH (ref 4.0–10.5)
nRBC: 0 % (ref 0.0–0.2)

## 2019-08-31 LAB — MAGNESIUM: Magnesium: 2 mg/dL (ref 1.7–2.4)

## 2019-08-31 MED ORDER — POTASSIUM CHLORIDE ER 20 MEQ PO TBCR: 20.0000 meq | EXTENDED_RELEASE_TABLET | Freq: Every day | ORAL | 1 refills | Status: DC

## 2019-08-31 MED ORDER — FUROSEMIDE 40 MG PO TABS: 40.0000 mg | ORAL_TABLET | Freq: Every day | ORAL | 1 refills | Status: DC

## 2019-08-31 MED ORDER — COLCHICINE 0.6 MG PO TABS: 0.6000 mg | ORAL_TABLET | Freq: Every day | ORAL | 1 refills | Status: DC

## 2019-08-31 MED ORDER — TRAMADOL HCL 50 MG PO TABS: 50.0000 mg | ORAL_TABLET | ORAL | 0 refills | Status: DC | PRN

## 2019-08-31 MED ORDER — AMIODARONE HCL 200 MG PO TABS
200.0000 mg | ORAL_TABLET | Freq: Two times a day (BID) | ORAL | Status: DC
  Administered 2019-08-31 – 2019-09-01 (×2): 200 mg via ORAL
  Filled 2019-08-31 (×3): qty 1

## 2019-08-31 MED ORDER — AMIODARONE HCL 200 MG PO TABS: 200.0000 mg | ORAL_TABLET | Freq: Every day | ORAL | 1 refills | Status: DC

## 2019-08-31 MED ORDER — AMIODARONE IV BOLUS ONLY 150 MG/100ML
150.0000 mg | Freq: Once | INTRAVENOUS | Status: DC
  Filled 2019-08-31: qty 100

## 2019-08-31 MED ORDER — AMIODARONE HCL 200 MG PO TABS
200.0000 mg | ORAL_TABLET | Freq: Once | ORAL | Status: AC
  Administered 2019-08-31: 200 mg via ORAL

## 2019-08-31 MED ORDER — CAPTOPRIL 12.5 MG PO TABS: 6.2500 mg | ORAL_TABLET | Freq: Three times a day (TID) | ORAL | 2 refills | Status: DC

## 2019-08-31 MED FILL — Heparin Sodium (Porcine) Inj 1000 Unit/ML: INTRAMUSCULAR | Qty: 30 | Status: AC

## 2019-08-31 MED FILL — Sodium Chloride IV Soln 0.9%: INTRAVENOUS | Qty: 6000 | Status: AC

## 2019-08-31 MED FILL — Mannitol IV Soln 20%: INTRAVENOUS | Qty: 500 | Status: AC

## 2019-08-31 MED FILL — Heparin Sodium (Porcine) Inj 1000 Unit/ML: INTRAMUSCULAR | Qty: 20 | Status: AC

## 2019-08-31 MED FILL — Electrolyte-R (PH 7.4) Solution: INTRAVENOUS | Qty: 4000 | Status: AC

## 2019-08-31 MED FILL — Sodium Bicarbonate IV Soln 8.4%: INTRAVENOUS | Qty: 50 | Status: AC

## 2019-08-31 MED FILL — Calcium Chloride Inj 10%: INTRAVENOUS | Qty: 10 | Status: AC

## 2019-08-31 NOTE — Progress Notes (Addendum)
8 Days Post-Op Procedure(s) (LRB): REPAIR OF TYPE A AORTIC DISSECTION USING HEMASHIELF PLATINUM 30MM VASCULAR GRAFT (N/A) Mitral Valve Repair (Mvr) Subjective: Awake and alert, had a good day yesterday. No new concerns. Independent with ambulation. Voiding with no difficulty since Foley catheter removed.  Wt.is 1.4kg below pre-op.   Objective: Vital signs in last 24 hours: Temp:  [98 F (36.7 C)-99.3 F (37.4 C)] 98.5 F (36.9 C) (02/15 0400) Pulse Rate:  [86-102] 86 (02/15 0400) Cardiac Rhythm: Normal sinus rhythm;Bundle branch block (02/15 0700) Resp:  [18-25] 25 (02/14 2309) BP: (101-117)/(68-78) 110/78 (02/15 0400) SpO2:  [90 %-93 %] 90 % (02/15 0400) Weight:  [78.4 kg] 78.4 kg (02/15 0438)     Intake/Output from previous day: 02/14 0701 - 02/15 0700 In: 770 [P.O.:770] Out: 1950 [Urine:1950] Intake/Output this shift: No intake/output data recorded.  General appearance: alert, cooperative and no distress Neurologic: intact Heart: regular rate and rhythm Lungs: Breath sounds are clear Abdomen: soft and non-tender Extremities: All well perfused, palpable distal pulses Wound: The sternal and right subclavian incisions are intact and healing with no sign of complication.   Lab Results: Recent Labs    08/29/19 0230  WBC 8.4  HGB 8.0*  HCT 24.1*  PLT 164   BMET:  Recent Labs    08/29/19 0230 08/30/19 0103  NA 138 138  K 3.6 4.3  CL 106 102  CO2 26 26  GLUCOSE 113* 110*  BUN 12 15  CREATININE 0.95 1.02  CALCIUM 7.9* 8.5*    PT/INR: No results for input(s): LABPROT, INR in the last 72 hours. ABG    Component Value Date/Time   PHART 7.402 08/23/2019 2033   HCO3 18.8 (L) 08/23/2019 2033   TCO2 20 (L) 08/23/2019 2033   ACIDBASEDEF 5.0 (H) 08/23/2019 2033   O2SAT 98.0 08/23/2019 2033   CBG (last 3)  Recent Labs    08/28/19 0938 08/28/19 1112  GLUCAP 99 103*     Assessment/Plan: S/P Procedure(s) (LRB): REPAIR OF TYPE A AORTIC DISSECTION USING  HEMASHIELF PLATINUM 30MM VASCULAR GRAFT (N/A) Mitral Valve Repair (Mvr)  -POD5repair Type A aortic dissection with a straight ascending aortic graft and MV repair for MR. Hemodynamics stable.  F/U CTA of thoracic aorta on [id number] shows no complication of the repair and no distal aortic abnormality. BP control is acceptable.   -Volume excess- Improved with diuresis. Wt now 1.4kg below pre-op.  -Hypokalemia- corrected,  monitor.   -Expected acute blood loss anemia- Transfused early post-op- Hct has trended back up over the past several days.    -Thrombocytopenia- resolved  -Pulmonary: Left pleural effusion- POD4  U/S-guided thoracentesis by IR with 4105ml dark bloody fluid evacuated.CXR improved, good sats on RA.  -Disposition- Overall making satisfactory progress. Planning discharge to home today (power has been restored at home).  Instructions given. F/U in the office in 1 week.   12:33pm ADDENDUM: Mr. Harwell developed atrial fibrillation after the discharge order was placed this AM. VR 130's-140's and SBP ~100.  Initially treated with metoprolol IV x 2 doses (5mg  followed by 2.5mg ) which slowed the VR momentarily. He was loaded with amiodarone prophylacticly early post-op and has been on a maintenance dose of 200mg  daily for several days.   Will hold discharge, re- bolus with amiodarone 150mg  IV and increase the oral amiodarone to 200mg  BID.  Stat BMP and Mg++ level are pending, will correct electrolytes as required.     LOS: 8 days    Antony Odea, PA-C [phone]  08/31/2019  

## 2019-08-31 NOTE — Progress Notes (Signed)
Pt stood up at bedside and HR went into the 90's and has sustained 80s-90s Pt ambulated to bathroom and in the hall w/ no c/o pain, SOB or dizziness. RN notified provider and provider put amiodarone drip on hold and to continue w/ PO amiodarone. Pt is now laying in bed, visiting w/ spouse and states "I feel good". Will continue to monitor.  VS: 110/75, HR 85, Resp 25, O2 96

## 2019-08-31 NOTE — Progress Notes (Signed)
RN performed daily assessment and administered morning medications. After pt received meds, pt went into SVT w/ HR highest at 168, pt had sustained between 140s-160s asymptomatic, pt states "I feel good.". RN paged PA and gave update on pt's status and informed him that PRN medication would be given and would continue to monitor. Awaiting new orders if necessary. Pt is comfortable at this time laying in bed watching tv.

## 2019-08-31 NOTE — Progress Notes (Signed)
CARDIAC REHAB PHASE I   PRE:  Rate/Rhythm: 91 SR  BP:  Supine:   Sitting: 110/76  Standing:    SaO2: 94%RA  MODE:  Ambulation:300  ft Then 400 ft with rollator  POST:  Rate/Rhythm: 103 ST  BP:  Supine:   Sitting: 125/79  Standing:     SaO2: 94%RA 0830-0900 Pt walked 300 ft with rolling walker and hand held asst. Then went and got rollator and pt independent and steady with it. Would recommend rollator for home use to increase endurance and for safety. Notified nursing staff.  Pt walked 400 ft with rollator independently. To sitting on side of bed after walk.   [name], RN BSN  08/31/2019 8:56 AM

## 2019-08-31 NOTE — Progress Notes (Signed)
Pt continues to sustain HR in the 80s. Pt states "I still feel good, almost like I could get up and run" Pt is eating dinner and is not expressing any distress. Will continue to monitor.

## 2019-08-31 NOTE — Progress Notes (Signed)
   Vital Signs MEWS/VS Documentation      08/31/2019 0931 08/31/2019 0933 08/31/2019 0940 08/31/2019 0949   MEWS Score:  3  2  3  3    MEWS Score Color:  Yellow  Yellow  Yellow  Yellow   BP:  --  104/73  90/71  --   Level of Consciousness:  --  --  --  Alert           Henry Hayden 08/31/2019,9:58 AM Pt went into SVT after receiving morning meds. Pt had no c/o pain  Or distress at that time. Pt stated he "felt good". RN notified provider and awaiting new orders. Pt received PRN medication and HR came down and sustaining in the 120s-130s. Monitor reading Afib. Pt is laying in bed watching tv. Will continue to monitor under yellow mews guidelines

## 2019-09-01 MED ORDER — AMIODARONE HCL 200 MG PO TABS: 200.0000 mg | ORAL_TABLET | Freq: Two times a day (BID) | ORAL | 1 refills | Status: DC

## 2019-09-01 NOTE — Progress Notes (Signed)
CARDIAC REHAB PHASE I   PRE:  Rate/Rhythm: 82 SR  BP:  Supine:   Sitting: 108/66  Standing:    SaO2: 97%RA  MODE:  Ambulation: 400 ft   POST:  Rate/Rhythm: 88 SR  BP:  Supine:   Sitting: 139/89  Standing:    SaO2: 96%RA 0854-0925 Pt walked 400 ft on RA independently with rollator. Tolerated well. Has order for rollator for home. Remained in NSR. Back to recliner with call bell.   [name], RN BSN  09/01/2019 9:21 AM

## 2019-09-01 NOTE — Discharge Summary (Signed)
ADDENDUM to DISCHARGE SUMMARY FOR 08/31/19  After the discharge order was placed on 08/31/2019, the patient developed a supraventricular tachycardia in the 140s.  This rhythm was regular.  He was initially treated with 2 doses of IV metoprolol which briefly slowed the rhythm to around 110 but then he reverted back to SVT in the 140s.  After about 1 hour, rhythm became  irregular.  EKG confirmed atrial fibrillation.  An additional bolus of IV amiodarone was ordered but prior to its administration he converted back to sinus rhythm in the 80s.  He was given additional amiodarone orally over the course of the day for a total of 600 mg over the 24 hours.  He was monitored carefully and had no further arrhythmias of significance.  He is discharged home in stable satisfactory condition and in normal sinus rhythm.  His exam is otherwise unchanged.  Since he had been previously loaded with amiodarone earlier in admission, he will be discharged on oral amiodarone 200 mg p.o. twice daily for 6 more days then reduce the dose back to 200 mg once daily.

## 2019-09-01 NOTE — TOC Transition Note (Signed)
Transition of Care (TOC) - CM/SW Discharge Note Marvetta Gibbons RN, BSN Transitions of Care Unit 4E- RN Case Manager ([place] cross coverage) [phone]   Patient Details  Name: Henry Hayden MRN: [id number] Date of Birth: January 14, 1951  Transition of Care [facility]) CM/SW Contact:  Dawayne Patricia, RN Phone Number: 09/01/2019, 10:18 AM   Clinical Narrative:    Pt stable for transition home today, per Cardiac rehab pt needs rollator for home- order placed and call made to [facility] with [employer] for DME need- rollator to be delivered to room prior to discharge. Pt has PCP and f/u appointments in place, meds have been sent to [facility].    Final next level of care: Home/Self Care Barriers to Discharge: No Barriers Identified   Patient Goals and CMS Choice     Choice offered to / list presented to : Patient  Discharge Placement               Home        Discharge Plan and Services   Discharge Planning Services: CM Consult Post Acute Care Choice: Durable Medical Equipment          DME Arranged: Walker rolling with seat DME Agency: [employer] Date DME Agency Contacted: 09/01/19 Time DME Agency Contacted: [id number] Representative spoke with at DME Agency: [place]: NA [place] Agency: NA        Social Determinants of Health ([place]) Interventions     Readmission Risk Interventions Readmission Risk Prevention Plan 09/01/2019  Post Dischage Appt Complete  Medication Screening Complete  Transportation Screening Complete

## 2019-09-01 NOTE — Progress Notes (Signed)
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   Converted back to SR before IV amiodarone bolus was administered yesterday so it was discontinued and he was given additional oral amiodarone (200mg  x 3 doses total). No further Afib or SVT.   Objective: Vital signs in last 24 hours: Temp:  [98 F (36.7 C)-98.4 F (36.9 C)] 98 F (36.7 C) (02/16 0704) Pulse Rate:  [81-85] 85 (02/16 0704) Cardiac Rhythm: Normal sinus rhythm (02/15 1901) Resp:  [12-27] 19 (02/16 0704) BP: (74-116)/(57-79) 104/71 (02/16 0704) SpO2:  [91 %-97 %] 97 % (02/16 0704) Weight:  [77.7 kg] 77.7 kg (02/16 0454)    Intake/Output from previous day: 02/15 0701 - 02/16 0700 In: 1560 [P.O.:1560] Out: 1851 [Urine:1850; Stool:1] Intake/Output this shift: No intake/output data recorded.  Physical Exam: General appearance: alert, cooperative and no distress Neurologic: intact Heart: regular rate and rhythm, no further afib or SVT on monitor review. Lungs: Breath sounds are clear Abdomen: soft and non-tender Extremities: All well perfused, palpable distal pulses Wound: The sternal and right subclavian incisions are intact and healing with no sign of complication.   Lab Results: Recent Labs    08/31/19 1248  WBC 10.9*  HGB 9.1*  HCT 28.1*  PLT 328   BMET:  Recent Labs    08/30/19 0103 08/31/19 1143  NA 138 133*  K 4.3 4.2  CL 102 100  CO2 26 23  GLUCOSE 110* 115*  BUN 15 17  CREATININE 1.02 1.07  CALCIUM 8.5* 8.6*    PT/INR: No results for input(s): LABPROT, INR in the last 72 hours. ABG    Component Value Date/Time   PHART 7.402 08/23/2019 2033   HCO3 18.8 (L) 08/23/2019 2033   TCO2 20 (L) 08/23/2019 2033   ACIDBASEDEF 5.0 (H) 08/23/2019 2033   O2SAT 98.0 08/23/2019 2033   CBG (last 3)  No results for input(s): GLUCAP in the last 72  hours.  Assessment/Plan: S/P Procedure(s) (LRB): REPAIR OF TYPE A AORTIC DISSECTION USING HEMASHIELF PLATINUM 30MM VASCULAR GRAFT (N/A) Mitral Valve Repair (Mvr)  -POD6repair Type A aortic dissection with a straight ascending aortic graft and MV repair for MR. Hemodynamics stable. F/U CTA of thoracic aorta on [id number] showed no complication of the repair and no distal aortic abnormality. BP control is good.  -Post-op atrial fibrillation--Converted back to SR without intervention but he was given an additional 400mg  of oral amiodarone yesterday. Mg++ and K+ WNL. Discharge on amiodarone 200mg  po BID for 1 week then 200mg  daily.   -Volume excess-Resolved.  -Hypokalemia-corrected.   -Expected acute blood loss anemia- Transfused early post-op- Hcthas trended back up over the past several days.   -Thrombocytopenia- resolved  -Pulmonary:Left pleural effusion-POD5U/S-guided thoracentesis by IR with 45ml dark bloody fluid evacuated.CXR improved, good sats on RA.  -Disposition- Discharge to home today.  Instructions given. F/U in the office in 1 week.    LOS: 9 days    Antony Odea, [place] [phone] 09/01/2019

## 2019-09-06 ENCOUNTER — Other Ambulatory Visit: Payer: Self-pay | Admitting: Cardiothoracic Surgery

## 2019-09-06 DIAGNOSIS — Z9889 Other specified postprocedural states: Secondary | ICD-10-CM

## 2019-09-07 ENCOUNTER — Ambulatory Visit (INDEPENDENT_AMBULATORY_CARE_PROVIDER_SITE_OTHER): Payer: Self-pay | Admitting: Cardiothoracic Surgery

## 2019-09-07 ENCOUNTER — Encounter: Payer: Self-pay | Admitting: Cardiothoracic Surgery

## 2019-09-07 ENCOUNTER — Other Ambulatory Visit: Payer: Self-pay

## 2019-09-07 ENCOUNTER — Ambulatory Visit
Admission: RE | Admit: 2019-09-07 | Discharge: 2019-09-07 | Disposition: A | Discharge status: Home / Self Care | Payer: Medicare Other | Source: Ambulatory Visit | Attending: Cardiothoracic Surgery | Admitting: Cardiothoracic Surgery

## 2019-09-07 VITALS — BP 128/85 | HR 81 | Temp 99.8°F | Resp 16 | Ht 72.0 in | Wt 174.4 lb

## 2019-09-07 DIAGNOSIS — J9811 Atelectasis: Secondary | ICD-10-CM | POA: Diagnosis not present

## 2019-09-07 DIAGNOSIS — Z9889 Other specified postprocedural states: Secondary | ICD-10-CM

## 2019-09-07 DIAGNOSIS — I7101 Dissection of thoracic aorta: Secondary | ICD-10-CM

## 2019-09-07 DIAGNOSIS — I71019 Dissection of thoracic aorta, unspecified: Secondary | ICD-10-CM

## 2019-09-07 MED ORDER — LISINOPRIL 10 MG PO TABS: 10.0000 mg | ORAL_TABLET | Freq: Every day | ORAL | 11 refills | Status: DC

## 2019-09-07 NOTE — Progress Notes (Signed)
      [address]       [place] [address]             [phone]     CARDIOTHORACIC SURGERY OFFICE NOTE  Referring Provider is Rory Percy, MD Primary Cardiologist is No primary care provider on file. PCP is Rory Percy, MD   HPI:  69 yo man is 2 weeks s/p repair of Type A aortic dissection. He was discharged in good condition and had previously not required regular medical care. No complaints today, although BP measurements have been up and down. Denies chest pain or SOB/DOE.   Current Outpatient Medications  Medication Sig Dispense Refill  . acetaminophen (TYLENOL) 160 MG/5ML liquid Take 500 mg by mouth in the morning, at noon, and at bedtime.    Marland Kitchen amiodarone (PACERONE) 200 MG tablet Take 1 tablet (200 mg total) by mouth 2 (two) times daily. For 6 days then reduce dose to once daily. 30 tablet 1  . aspirin EC 81 MG tablet Take 81 mg by mouth daily.    . Calcium Carbonate Antacid (TUMS PO) Take 1 tablet by mouth daily as needed (acid reflux/indigestion).    . CALCIUM PO Take 1 tablet by mouth daily.    . Cholecalciferol (VITAMIN D3 PO) Take 1 tablet by mouth daily.    . colchicine 0.6 MG tablet Take 1 tablet (0.6 mg total) by mouth daily. 15 tablet 1  . Multiple Vitamin (MULTIVITAMIN WITH MINERALS) TABS tablet Take 1 tablet by mouth daily.    . Saw Palmetto, Serenoa repens, (SAW PALMETTO PO) Take 1 capsule by mouth daily.    Marland Kitchen lisinopril (ZESTRIL) 10 MG tablet Take 1 tablet (10 mg total) by mouth daily. 30 tablet 11   No current facility-administered medications for this visit.      Physical Exam:   BP 128/85 (BP Location: Left Arm, Patient Position: Sitting, Cuff Size: Normal)   Pulse 81   Temp 99.8 F (37.7 C)   Resp 16   Ht 6' (1.829 m)   Wt 79.1 kg Comment: 174.4LB  SpO2 95% Comment: RA  BMI 23.65 kg/m   General:  Well-appearing,NAD  Chest:   cta  CV:   rrr  Incisions:  C/d/i  Abdomen:  sntnd  Extremities:  No edema  Diagnostic  Tests:  CXR with left hemidiaphragm elevation; otherwise clear lung fields   Impression:  Doing well after repair of Type A dissection and mitral edge-to-edge repair  Plan:  Changed from captopril to lisinopril 10 mg daily Ok to discontinue lasix/potassium F/u here 2 weeks; scheduled to see Dr. Domenic Polite in next couple of weeks as well.  I spent in excess of 20 minutes during the conduct of this office consultation and >50% of this time involved direct face-to-face encounter with the patient for counseling and/or coordination of their care.  Level 2                 10 minutes Level 3                 15 minutes Level 4                 25 minutes Level 5                 40 minutes  B. Murvin Natal, MD 09/07/2019 12:50 PM

## 2019-09-14 ENCOUNTER — Other Ambulatory Visit: Payer: Self-pay

## 2019-09-14 ENCOUNTER — Encounter: Payer: Self-pay | Admitting: Cardiology

## 2019-09-14 ENCOUNTER — Ambulatory Visit (INDEPENDENT_AMBULATORY_CARE_PROVIDER_SITE_OTHER): Payer: Medicare Other | Admitting: Cardiology

## 2019-09-14 VITALS — BP 108/69 | HR 86 | Temp 98.5°F | Ht 72.0 in | Wt 180.0 lb

## 2019-09-14 DIAGNOSIS — I340 Nonrheumatic mitral (valve) insufficiency: Secondary | ICD-10-CM

## 2019-09-14 DIAGNOSIS — I10 Essential (primary) hypertension: Secondary | ICD-10-CM

## 2019-09-14 DIAGNOSIS — I9789 Other postprocedural complications and disorders of the circulatory system, not elsewhere classified: Secondary | ICD-10-CM

## 2019-09-14 DIAGNOSIS — I4891 Unspecified atrial fibrillation: Secondary | ICD-10-CM

## 2019-09-14 DIAGNOSIS — Z8679 Personal history of other diseases of the circulatory system: Secondary | ICD-10-CM

## 2019-09-14 NOTE — Patient Instructions (Signed)
Medication Instructions:  Your physician recommends that you continue on your current medications as directed. Please refer to the Current Medication list given to you today.  *If you need a refill on your cardiac medications before your next appointment, please call your pharmacy*   Lab Work: none If you have labs (blood work) drawn today and your tests are completely normal, you will receive your results only by: Marland Kitchen MyChart Message (if you have MyChart) OR . A paper copy in the mail If you have any lab test that is abnormal or we need to change your treatment, we will call you to review the results.   Testing/Procedures: Your physician has requested that you have an echocardiogram. Echocardiography is a painless test that uses sound waves to create images of your heart. It provides your doctor with information about the size and shape of your heart and how well your heart's chambers and valves are working. This procedure takes approximately one hour. There are no restrictions for this procedure.     Follow-Up: At [facility], you and your health needs are our priority.  As part of our continuing mission to provide you with exceptional heart care, we have created designated Provider Care Teams.  These Care Teams include your primary Cardiologist (physician) and Advanced Practice Providers (APPs -  Physician Assistants and Nurse Practitioners) who all work together to provide you with the care you need, when you need it.  We recommend signing up for the patient portal called "MyChart".  Sign up information is provided on this After Visit Summary.  MyChart is used to connect with patients for Virtual Visits (Telemedicine).  Patients are able to view lab/test results, encounter notes, upcoming appointments, etc.  Non-urgent messages can be sent to your provider as well.   To learn more about what you can do with MyChart, go to [email or web address].    Your next appointment:   6  week(s)  The format for your next appointment:   In Person  Provider:   Rozann Lesches, MD   Other Instructions None      Thank you for choosing [facility] !

## 2019-09-14 NOTE — Progress Notes (Signed)
Cardiology Office Note  Date: 09/14/2019   ID: Henry Hayden, DOB 1951/07/16, MRN [id number]  PCP:  Rory Percy, MD  Evaluating Cardiologist: Satira Sark, MD Electrophysiologist:  None   Chief Complaint  Patient presents with  . Post hospital assessment    History of Present Illness: Henry Hayden is a 69 y.o. male presenting to establish cardiology follow-up, this is our first meeting today.  I reviewed extensive records and updated the chart.  He was hospitalized in early February with sudden onset chest pain and diagnosis of type A aortic dissection.  He had been previously diagnosed with COVID-19 in December 2020.  He was managed at [facility] following transfer from [facility].  He underwent emergent repair of the type A aortic dissection with a 30 mm Hemashield straight graft, also underwent mitral valve repair due to TEE findings of moderate to severe mitral regurgitation (Alfieri stitch).  Postoperative course notable for thoracentesis of moderate left-sided pleural effusion, also atrial fibrillation.  He is here today with his wife for a follow-up visit.  We reviewed home blood pressure and heart rate measurements, also weights.  He feels like he has been recuperating adequately.  Does report an intermittent cough and feeling of breathlessness when he coughs.  No dysphagia.  No wheezing or stridor.  He had a chest x-ray done recently which showed stable elevation of the left hemidiaphragm with basilar atelectasis and probable small effusion.  Recent follow-up visit noted with Dr. Orvan Seen in late February.  Blood pressure was 128/85 at that time.  I reviewed medication changes made at that time.  He will continue low-dose amiodarone to complete a 4-week course.  He does not report any palpitations.  I personally reviewed his ECG today which shows normal sinus rhythm.   Past Medical History:  Diagnosis Date  . Aortic dissection (HCC)    Type A  . Essential  hypertension   . Mitral regurgitation   . Postoperative atrial fibrillation [facility])     Past Surgical History:  Procedure Laterality Date  . AORTIC VALVE REPAIR N/A 08/23/2019   Procedure: REPAIR OF TYPE A AORTIC DISSECTION USING HEMASHIELF PLATINUM 30MM VASCULAR GRAFT;  Surgeon: Wonda Olds, MD;  Location: [facility];  Service: Open Heart Surgery;  Laterality: N/A;  . IR THORACENTESIS ASP PLEURAL SPACE W/IMG GUIDE  08/27/2019  . MITRAL VALVE REPAIR  08/23/2019   Procedure: Mitral Valve Repair (Mvr);  Surgeon: Wonda Olds, MD;  Location: [facility] OR;  Service: Open Heart Surgery;;    Current Outpatient Medications  Medication Sig Dispense Refill  . acetaminophen (TYLENOL) 160 MG/5ML liquid Take 500 mg by mouth in the morning, at noon, and at bedtime.    Marland Kitchen amiodarone (PACERONE) 200 MG tablet Take 1 tablet (200 mg total) by mouth 2 (two) times daily. For 6 days then reduce dose to once daily. 30 tablet 1  . aspirin EC 81 MG tablet Take 81 mg by mouth daily.    . Calcium Carbonate Antacid (TUMS PO) Take 1 tablet by mouth daily as needed (acid reflux/indigestion).    . CALCIUM PO Take 1 tablet by mouth daily.    . Cholecalciferol (VITAMIN D3 PO) Take 1 tablet by mouth daily.    . colchicine 0.6 MG tablet Take 1 tablet (0.6 mg total) by mouth daily. 15 tablet 1  . lisinopril (ZESTRIL) 10 MG tablet Take 1 tablet (10 mg total) by mouth daily. 30 tablet 11  . Multiple Vitamin (MULTIVITAMIN WITH  MINERALS) TABS tablet Take 1 tablet by mouth daily.    . Saw Palmetto, Serenoa repens, (SAW PALMETTO PO) Take 1 capsule by mouth daily.    . traMADol (ULTRAM) 50 MG tablet Take by mouth every 6 (six) hours as needed.     No current facility-administered medications for this visit.   Allergies:  Patient has no known allergies.   Social History: The patient  reports that he has never smoked. His smokeless tobacco use includes chew. He reports previous alcohol use. He reports that he does not use drugs.    Family History: The patient's family history includes Atrial fibrillation in his mother.   ROS: No fevers or chills.  No orthopnea or PND.  Physical Exam: VS:  BP 108/69   Pulse 86   Temp 98.5 F (36.9 C)   Ht 6' (1.829 m)   Wt 180 lb (81.6 kg)   SpO2 97%   BMI 24.41 kg/m , BMI Body mass index is 24.41 kg/m.  Wt Readings from Last 3 Encounters:  09/14/19 180 lb (81.6 kg)  09/07/19 174 lb 6.4 oz (79.1 kg)  09/01/19 171 lb 6.4 oz (77.7 kg)    General: Patient appears comfortable at rest. HEENT: Conjunctiva and lids normal, wearing a mask. Neck: Supple, no elevated JVP or carotid bruits, no thyromegaly. Lungs: Decreased breath sounds left base, no wheezing, nonlabored breathing at rest. Thorax: Sternal incision well-healed. Cardiac: Regular rate and rhythm, no S3, soft systolic murmur, no pericardial rub. Abdomen: Soft, nontender, bowel sounds present. Extremities: No pitting edema, distal pulses 2+. Skin: Warm and dry. Musculoskeletal: No kyphosis. Neuropsychiatric: Alert and oriented x3, affect grossly appropriate.  ECG:  An ECG dated 08/31/2019 was personally reviewed today and demonstrated:  Possible atypical atrial flutter with 2:1 block.  Recent Labwork: 08/22/2019: ALT 24; AST 34 08/31/2019: BUN 17; Creatinine, Ser 1.07; Hemoglobin 9.1; Magnesium 2.0; Platelets 328; Potassium 4.2; Sodium 133     Component Value Date/Time   CHOL 155 08/22/2019 2345   TRIG 110 08/22/2019 2345   HDL 47 08/22/2019 2345   CHOLHDL 3.3 08/22/2019 2345   VLDL 22 08/22/2019 2345   LDLCALC 86 08/22/2019 2345    Other Studies Reviewed Today:  Intraoperative TEE 08/23/2019: POST-OP IMPRESSIONS  - Left Ventricle: The left ventricle is unchanged from pre-bypass.  - Aorta: there is no dissection present in the aorta A graft was placed in  the  ascending aorta for repair.  - Aortic Valve: The aortic valve appears unchanged from pre-bypass.  - Mitral Valve: There is mild regurgitation.S/P  mitral Valve repair.  Alfieri  stitch A2 to P2.  - Tricuspid Valve: The tricuspid valve appears unchanged from pre-bypass.   PRE-OP FINDINGS  Left Ventricle: The left ventricle has normal systolic function, with an  ejection fraction of 60-65%. The cavity size was normal. There is mild  concentric left ventricular hypertrophy.   Right Ventricle: The right ventricle has normal systolic function. The  cavity was normal. There is no increase in right ventricular wall  thickness.   Left Atrium: Left atrial size was normal in size. The left atrial  appendage is well visualized and there is no evidence of thrombus present.   Interatrial Septum: No atrial level shunt detected by color flow Doppler.   Pericardium: There is no evidence of pericardial effusion.   Mitral Valve: The mitral valve is normal in structure. Mild thickening of  the mitral valve leaflet. Mitral valve regurgitation is severe by color  flow Doppler. The  MR jet is posteriorly-directed.   Tricuspid Valve: The tricuspid valve was normal in structure. Tricuspid  valve regurgitation was not visualized by color flow Doppler.   Aortic Valve: The aortic valve is normal in structure. Aortic valve  regurgitation was not visualized by color flow Doppler. There is no  stenosis of the aortic valve.   Pulmonic Valve: The pulmonic valve was not assessed.  Pulmonic valve regurgitation is not visualized by color flow Doppler.   Aorta: There is evidence of a dissection in the aortic root, ascending  aorta and aortic arch. The dissection entry point is at the aortic arch  and the exit point appears to be at the at the ascending aorta.  Chest x-ray 09/07/2019: FINDINGS: Stable cardiomegaly. No pneumothorax is noted. Right lung is clear. Sternal sutures are noted. Elevated left hemidiaphragm is noted with mild left basilar atelectasis and possible effusion. Bony thorax is unremarkable.  IMPRESSION: Stable elevated left  hemidiaphragm with mild left basilar atelectasis and possible effusion. No pneumothorax is noted.  Assessment and Plan:  1.  Status post type A aortic dissection with emergent repair utilizing 30 mm Hemashield graft.  He is clinically stable at this time, recent chest x-ray reviewed.  He had follow-up with Dr. Orvan Seen as well.  Continue efforts at blood pressure control and observation.  2.  Moderate to severe mitral regurgitation noted at perioperative TEE in setting of above.  Now status post repair with Alfieri stitch.  We will obtain a follow-up transthoracic echocardiogram for baseline.  3.  Postoperative atrial fibrillation/flutter.  No palpitations and noted to be in sinus rhythm by ECG today.  Complete 4-week course of amiodarone and then discontinue.  4.  Essential hypertension, blood pressure is well controlled today.  Continue lisinopril.  Medication Adjustments/Labs and Tests Ordered: Current medicines are reviewed at length with the patient today.  Concerns regarding medicines are outlined above.   Tests Ordered: Orders Placed This Encounter  Procedures  . EKG 12-Lead  . ECHOCARDIOGRAM COMPLETE    Medication Changes: No orders of the defined types were placed in this encounter.   Disposition:  Follow up 6 weeks in the [place] office.  Signed, Satira Sark, MD, [facility] 09/14/2019 1:52 PM    [facility] at [facility] [address] [place], [place] [address] Phone: [phone]; Fax: [phone]

## 2019-09-16 ENCOUNTER — Other Ambulatory Visit: Payer: Self-pay | Admitting: Physician Assistant

## 2019-09-17 ENCOUNTER — Other Ambulatory Visit: Payer: Self-pay

## 2019-09-17 ENCOUNTER — Ambulatory Visit (HOSPITAL_COMMUNITY)
Admission: RE | Admit: 2019-09-17 | Discharge: 2019-09-17 | Disposition: A | Discharge status: Home / Self Care | Payer: Medicare Other | Source: Ambulatory Visit | Attending: Cardiology | Admitting: Cardiology

## 2019-09-17 ENCOUNTER — Other Ambulatory Visit: Payer: Self-pay | Admitting: Cardiothoracic Surgery

## 2019-09-17 DIAGNOSIS — I340 Nonrheumatic mitral (valve) insufficiency: Secondary | ICD-10-CM

## 2019-09-17 DIAGNOSIS — Z9889 Other specified postprocedural states: Secondary | ICD-10-CM

## 2019-09-17 NOTE — Progress Notes (Signed)
*  PRELIMINARY RESULTS* Echocardiogram 2D Echocardiogram has been performed.  Henry Hayden 09/17/2019, 3:42 PM

## 2019-09-18 ENCOUNTER — Other Ambulatory Visit: Payer: Self-pay | Admitting: Cardiology

## 2019-09-21 ENCOUNTER — Ambulatory Visit
Admission: RE | Admit: 2019-09-21 | Discharge: 2019-09-21 | Disposition: A | Discharge status: Home / Self Care | Payer: Medicare Other | Source: Ambulatory Visit | Attending: Cardiothoracic Surgery | Admitting: Cardiothoracic Surgery

## 2019-09-21 ENCOUNTER — Ambulatory Visit (INDEPENDENT_AMBULATORY_CARE_PROVIDER_SITE_OTHER): Payer: Self-pay | Admitting: Cardiothoracic Surgery

## 2019-09-21 ENCOUNTER — Telehealth: Payer: Self-pay | Admitting: Cardiology

## 2019-09-21 ENCOUNTER — Other Ambulatory Visit: Payer: Self-pay

## 2019-09-21 VITALS — BP 136/84 | HR 78 | Temp 97.9°F | Resp 20 | Ht 72.0 in | Wt 181.0 lb

## 2019-09-21 DIAGNOSIS — Z8679 Personal history of other diseases of the circulatory system: Secondary | ICD-10-CM

## 2019-09-21 DIAGNOSIS — Z9889 Other specified postprocedural states: Secondary | ICD-10-CM

## 2019-09-21 DIAGNOSIS — I7100 Dissection of unspecified site of aorta: Secondary | ICD-10-CM | POA: Diagnosis not present

## 2019-09-21 DIAGNOSIS — J9811 Atelectasis: Secondary | ICD-10-CM | POA: Diagnosis not present

## 2019-09-21 NOTE — Telephone Encounter (Signed)
Order placed

## 2019-09-21 NOTE — Progress Notes (Signed)
[address]       [place] [address]             [phone]     CARDIOTHORACIC SURGERY OFFICE NOTE  Referring Provider is Rory Percy, MD Primary Cardiologist is No primary care provider on file. PCP is Rory Percy, MD   HPI:  69 yo man is one month s/p repair of Type A aortic dissection and edge-to-edge Mitral valve repair. He returns for 2nd outpatient visit. No complaints. As an inpatient, he had brief afib and underwent L thoracentesis x 1.    Current Outpatient Medications  Medication Sig Dispense Refill  . acetaminophen (TYLENOL) 160 MG/5ML liquid Take 500 mg by mouth in the morning, at noon, and at bedtime.    Marland Kitchen amiodarone (PACERONE) 200 MG tablet Take 1 tablet (200 mg total) by mouth 2 (two) times daily. For 6 days then reduce dose to once daily. (Patient taking differently: Take 200 mg by mouth daily. For 6 days then reduce dose to once daily.) 30 tablet 1  . aspirin EC 81 MG tablet Take 81 mg by mouth daily.    . benzonatate (TESSALON) 100 MG capsule Take by mouth 3 (three) times daily as needed for cough.    . Calcium Carbonate Antacid (TUMS PO) Take 1 tablet by mouth daily as needed (acid reflux/indigestion).    . CALCIUM PO Take 1 tablet by mouth daily.    . Cholecalciferol (VITAMIN D3 PO) Take 1 tablet by mouth daily.    . colchicine 0.6 MG tablet Take 1 tablet (0.6 mg total) by mouth daily. 15 tablet 1  . lisinopril (ZESTRIL) 10 MG tablet Take 1 tablet (10 mg total) by mouth daily. 30 tablet 11  . Multiple Vitamin (MULTIVITAMIN WITH MINERALS) TABS tablet Take 1 tablet by mouth daily.    . phenol (CHLORASEPTIC) 1.4 % LIQD Use as directed 1 spray in the mouth or throat as needed for throat irritation / pain.    . Saw Palmetto, Serenoa repens, (SAW PALMETTO PO) Take 1 capsule by mouth daily.    . traMADol (ULTRAM) 50 MG tablet Take by mouth every 6 (six) hours as needed.     No current facility-administered medications for this visit.       Physical Exam:   BP 136/84   Pulse 78   Temp 97.9 F (36.6 C) (Skin)   Resp 20   Ht 6' (1.829 m)   Wt 82.1 kg   SpO2 98%   BMI 24.55 kg/m   General:  Well-appearing, NAD  Chest:   cta  CV:   rrr  Incisions:  Well-healed  Abdomen:  sntnd  Extremities:  No edema  Diagnostic Tests:  I have reviewed echo from 3/4, showing trivial MR and no AI.   Impression:  Doing well after emergent repair of Type A dissection with mitral valve repair due to redundant anterior leaflet chordae at A2  Plan:  May liberalize activity now towards goal of full activity in 2 weeks. May drive car today F/u in 6 months with echo to assess valve repairs.   I spent in excess of 20 minutes during the conduct of this office consultation and >50% of this time involved direct face-to-face encounter with the patient for counseling and/or coordination of their care.  Level 2                 10 minutes Level 3  15 minutes Level 4                 25 minutes Level 5                 40 minutes  B. Murvin Natal, MD 09/21/2019 1:18 PM

## 2019-09-21 NOTE — Telephone Encounter (Signed)
Cardiac Rehab referral needs to be placed in Epic- for this pt so he can get started and will need Dr. Domenic Polite to sign it per Shauna Hugh

## 2019-10-27 ENCOUNTER — Encounter: Payer: Self-pay | Admitting: Cardiology

## 2019-10-27 ENCOUNTER — Ambulatory Visit: Payer: Medicare Other | Admitting: Cardiology

## 2019-10-27 VITALS — BP 122/82 | HR 87 | Ht 72.0 in | Wt 185.0 lb

## 2019-10-27 DIAGNOSIS — I340 Nonrheumatic mitral (valve) insufficiency: Secondary | ICD-10-CM

## 2019-10-27 DIAGNOSIS — I9789 Other postprocedural complications and disorders of the circulatory system, not elsewhere classified: Secondary | ICD-10-CM

## 2019-10-27 DIAGNOSIS — Z8679 Personal history of other diseases of the circulatory system: Secondary | ICD-10-CM

## 2019-10-27 DIAGNOSIS — I4891 Unspecified atrial fibrillation: Secondary | ICD-10-CM | POA: Diagnosis not present

## 2019-10-27 MED ORDER — METOPROLOL SUCCINATE ER 25 MG PO TB24: 12.5000 mg | ORAL_TABLET | Freq: Every day | ORAL | 3 refills | Status: DC

## 2019-10-27 MED ORDER — AMOXICILLIN 500 MG PO TABS: 2000.0000 mg | ORAL_TABLET | Freq: Once | ORAL | 3 refills | Status: AC

## 2019-10-27 NOTE — Progress Notes (Signed)
Cardiology Office Note  Date: 10/27/2019   ID: Henry Hayden, DOB 1951-03-31, MRN [id number]  PCP:  Henry Percy, MD  Cardiologist:  Henry Lesches, MD Electrophysiologist:  None   Chief Complaint  Patient presents with  . Cardiac follow-up    History of Present Illness: Henry Hayden is a 69 y.o. male last seen in March.  He is here today with his wife for a follow-up visit.  Continues to do well, has been increasing his activity in terms of walking, [place]  Still following good lifting restrictions, nothing over 25 pounds as yet.  He does not plan to pursue cardiac rehabilitation at this time.  He has had no palpitations, taken off of amiodarone since I last saw him.  Reviewed his medications which are listed below, we discussed starting Toprol-XL 12.5 mg daily.  Follow-up echocardiogram from March is outlined below.  LVEF was 60 to 65% and mitral valve morphology is consistent with Alfieri stitch with only trivial mitral regurgitation.  He has dental work planned, we discussed SBE prophylaxis.  Past Medical History:  Diagnosis Date  . Aortic dissection (HCC)    Type A  . Essential hypertension   . Mitral regurgitation   . Postoperative atrial fibrillation [facility])     Past Surgical History:  Procedure Laterality Date  . AORTIC VALVE REPAIR N/A 08/23/2019   Procedure: REPAIR OF TYPE A AORTIC DISSECTION USING HEMASHIELF PLATINUM 30MM VASCULAR GRAFT;  Surgeon: Henry Olds, MD;  Location: [place];  Service: Open Heart Surgery;  Laterality: N/A;  . IR THORACENTESIS ASP PLEURAL SPACE W/IMG GUIDE  08/27/2019  . MITRAL VALVE REPAIR  08/23/2019   Procedure: Mitral Valve Repair (Mvr);  Surgeon: Henry Olds, MD;  Location: [facility];  Service: Open Heart Surgery;;    Current Outpatient Medications  Medication Sig Dispense Refill  . acetaminophen (TYLENOL) 160 MG/5ML liquid Take 500 mg by mouth in the morning, at noon, and at bedtime.    Marland Kitchen aspirin EC 81 MG tablet  Take 81 mg by mouth daily.    . Calcium Carbonate Antacid (TUMS PO) Take 1 tablet by mouth daily as needed (acid reflux/indigestion).    . CALCIUM PO Take 1 tablet by mouth daily.    . Cholecalciferol (VITAMIN D3 PO) Take 1 tablet by mouth daily.    Marland Kitchen lisinopril (ZESTRIL) 10 MG tablet Take 1 tablet (10 mg total) by mouth daily. 30 tablet 11  . Multiple Vitamin (MULTIVITAMIN WITH MINERALS) TABS tablet Take 1 tablet by mouth daily.    . Saw Palmetto, Serenoa repens, (SAW PALMETTO PO) Take 1 capsule by mouth daily.    . traMADol (ULTRAM) 50 MG tablet Take by mouth every 6 (six) hours as needed.    Marland Kitchen amoxicillin (AMOXIL) 500 MG tablet Take 4 tablets (2,000 mg total) by mouth once for 1 dose. Take 30 minutes prior to dental procedures 1 tablet 3  . metoprolol succinate (TOPROL XL) 25 MG 24 hr tablet Take 0.5 tablets (12.5 mg total) by mouth daily. 45 tablet 3   No current facility-administered medications for this visit.   Allergies:  Patient has no known allergies.   ROS:   No dizziness or syncope.  Physical Exam: VS:  BP 122/82 (BP Location: Left Arm, Patient Position: Sitting, Cuff Size: Normal)   Pulse 87   Ht 6' (1.829 m)   Wt 185 lb (83.9 kg)   SpO2 95%   BMI 25.09 kg/m , BMI Body mass  index is 25.09 kg/m.  Wt Readings from Last 3 Encounters:  10/27/19 185 lb (83.9 kg)  09/21/19 181 lb (82.1 kg)  09/14/19 180 lb (81.6 kg)    General: Patient appears comfortable at rest. HEENT: Conjunctiva and lids normal, wearing a mask. Neck: Supple, no elevated JVP or carotid bruits, no thyromegaly. Thorax: Well-healed sternal incision. Lungs: Clear to auscultation, nonlabored breathing at rest. Cardiac: Regular rate and rhythm, no S3, soft systolic murmur, no pericardial rub. Abdomen: Soft, bowel sounds present, no guarding or rebound. Extremities: No pitting edema, distal pulses 2+.  ECG:  An ECG dated 09/14/2019 was personally reviewed today and demonstrated:  Normal sinus rhythm.   Recent Labwork: 08/22/2019: ALT 24; AST 34 08/31/2019: BUN 17; Creatinine, Ser 1.07; Hemoglobin 9.1; Magnesium 2.0; Platelets 328; Potassium 4.2; Sodium 133     Component Value Date/Time   CHOL 155 08/22/2019 2345   TRIG 110 08/22/2019 2345   HDL 47 08/22/2019 2345   CHOLHDL 3.3 08/22/2019 2345   VLDL 22 08/22/2019 2345   LDLCALC 86 08/22/2019 2345    Other Studies Reviewed Today:  Echocardiogram 09/17/2019: 1. Left ventricular ejection fraction, by estimation, is 60 to 65%. The  left ventricle has normal function. The left ventricle has no regional  wall motion abnormalities. There is mild left ventricular hypertrophy.  Left ventricular diastolic parameters  were normal.  2. Right ventricular systolic function is normal. The right ventricular  size is mildly enlarged. There is normal pulmonary artery systolic  pressure.  3. Hockey stick formation of the anterior mitral valve leaflet in a  pattern commonly seen in rheumatic heart disease. From record review  history of MV repair with edge-to-edge approximation of A2 scallop to P2  scallop which would actually be the cause  of this morpholoical appearance in this case and not rheumatic disease.  Mild mean gradient across the valve of 3 mmHg. . The mitral valve is  abnormal. Trivial mitral valve regurgitation.  4. The aortic valve has an indeterminant number of cusps. Aortic valve  regurgitation is not visualized. No aortic stenosis is present.  5. The inferior vena cava is normal in size with greater than 50%  respiratory variability, suggesting right atrial pressure of 3 mmHg.   Assessment and Plan:  1.  Status post emergent repair of type A aortic dissection utilizing 30 mm Hemashield graft.  Patient is clinically stable at this time with good blood pressure control today.  2.  Moderate to severe mitral regurgitation in the setting of problem #1 status post repair with Alfieri stitch.  Echocardiogram done recently shows LVEF  60 to 65% with only trivial mitral regurgitation, mitral valve morphology consistent with repair.  3.  Essential hypertension, blood pressure well controlled.  Adding low-dose Toprol-XL to lisinopril as well in light of aortic dissection history.  4.  Postoperative atrial fibrillation, no recurrences.  Continue observation.  Medication Adjustments/Labs and Tests Ordered: Current medicines are reviewed at length with the patient today.  Concerns regarding medicines are outlined above.   Tests Ordered: No orders of the defined types were placed in this encounter.   Medication Changes: Meds ordered this encounter  Medications  . amoxicillin (AMOXIL) 500 MG tablet    Sig: Take 4 tablets (2,000 mg total) by mouth once for 1 dose. Take 30 minutes prior to dental procedures    Dispense:  1 tablet    Refill:  3  . metoprolol succinate (TOPROL XL) 25 MG 24 hr tablet    Sig:  Take 0.5 tablets (12.5 mg total) by mouth daily.    Dispense:  45 tablet    Refill:  3    Disposition:  Follow up 4 months in the [place] office.  Signed, Satira Sark, MD, [facility] 10/27/2019 1:23 PM    [facility] [address] [place], [place] [address] Phone: [phone]; Fax: [phone]

## 2019-10-27 NOTE — Patient Instructions (Signed)
Medication Instructions:  Your physician recommends that you continue on your current medications as directed. Please refer to the Current Medication list given to you today.  Toprol XL 12.5 mg Daily  Take Amoxicillin 2000 mg 30 min prior to dental procedures   *If you need a refill on your cardiac medications before your next appointment, please call your pharmacy*   Lab Work: NONE  If you have labs (blood work) drawn today and your tests are completely normal, you will receive your results only by: Marland Kitchen MyChart Message (if you have MyChart) OR . A paper copy in the mail If you have any lab test that is abnormal or we need to change your treatment, we will call you to review the results.   Testing/Procedures: NONE    Follow-Up: At [facility], you and your health needs are our priority.  As part of our continuing mission to provide you with exceptional heart care, we have created designated Provider Care Teams.  These Care Teams include your primary Cardiologist (physician) and Advanced Practice Providers (APPs -  Physician Assistants and Nurse Practitioners) who all work together to provide you with the care you need, when you need it.  We recommend signing up for the patient portal called "MyChart".  Sign up information is provided on this After Visit Summary.  MyChart is used to connect with patients for Virtual Visits (Telemedicine).  Patients are able to view lab/test results, encounter notes, upcoming appointments, etc.  Non-urgent messages can be sent to your provider as well.   To learn more about what you can do with MyChart, go to [email or web address].    Your next appointment:   4 month(s)  The format for your next appointment:   In Person  Provider:   Rozann Lesches, MD or Carlyle Dolly, MD   Other Instructions Thank you for choosing [facility]!

## 2019-11-04 DIAGNOSIS — Z0120 Encounter for dental examination and cleaning without abnormal findings: Secondary | ICD-10-CM | POA: Diagnosis not present

## 2019-11-04 DIAGNOSIS — Z85828 Personal history of other malignant neoplasm of skin: Secondary | ICD-10-CM | POA: Diagnosis not present

## 2019-11-04 DIAGNOSIS — L570 Actinic keratosis: Secondary | ICD-10-CM | POA: Diagnosis not present

## 2019-11-27 DIAGNOSIS — I10 Essential (primary) hypertension: Secondary | ICD-10-CM | POA: Diagnosis not present

## 2019-11-27 DIAGNOSIS — Z6827 Body mass index (BMI) 27.0-27.9, adult: Secondary | ICD-10-CM | POA: Diagnosis not present

## 2019-11-27 DIAGNOSIS — Z0000 Encounter for general adult medical examination without abnormal findings: Secondary | ICD-10-CM | POA: Diagnosis not present

## 2019-12-21 DIAGNOSIS — M25552 Pain in left hip: Secondary | ICD-10-CM | POA: Diagnosis not present

## 2019-12-21 DIAGNOSIS — Z6827 Body mass index (BMI) 27.0-27.9, adult: Secondary | ICD-10-CM | POA: Diagnosis not present

## 2019-12-21 DIAGNOSIS — I10 Essential (primary) hypertension: Secondary | ICD-10-CM | POA: Diagnosis not present

## 2019-12-21 DIAGNOSIS — M25562 Pain in left knee: Secondary | ICD-10-CM | POA: Diagnosis not present

## 2019-12-28 ENCOUNTER — Telehealth: Payer: Self-pay

## 2019-12-28 NOTE — Telephone Encounter (Signed)
Would like to give BP updates   Please call Izora Gala [phone]   Thanks Joseph Art

## 2019-12-31 ENCOUNTER — Other Ambulatory Visit: Payer: Self-pay | Admitting: Student

## 2019-12-31 ENCOUNTER — Telehealth: Payer: Self-pay

## 2019-12-31 MED ORDER — METOPROLOL SUCCINATE ER 25 MG PO TB24: 25.0000 mg | ORAL_TABLET | Freq: Every day | ORAL | 3 refills | Status: DC

## 2019-12-31 MED ORDER — LISINOPRIL 20 MG PO TABS: 20.0000 mg | ORAL_TABLET | Freq: Every day | ORAL | 0 refills | Status: DC

## 2019-12-31 NOTE — Telephone Encounter (Signed)
I spoke with mrs Friscia and she asked what BP parameters Mr.Henry Hayden should follow.Per B.Aeschliman, PA-C, BP should be 130/80  as max.

## 2020-01-06 ENCOUNTER — Telehealth: Payer: Self-pay | Admitting: Cardiology

## 2020-01-06 NOTE — Telephone Encounter (Signed)
Spoke with wife who states that pt had dental work done on yesterday. After leaving dental office pt began to have chills and did have low grade fever of 99.9 at that time. No chest pain or SOB. HR was in the 120's at that time. Pt's wife states that a nurse family member came out to check on pt and stated that all vitals looked good and that his HR had come down by that evening. On today pt states that he is feeling fine. BP is 113/59 with HR of 89. Pt denies chills and fever on today. Please advise.

## 2020-01-06 NOTE — Telephone Encounter (Signed)
Pt notified and voiced understanding 

## 2020-01-06 NOTE — Telephone Encounter (Signed)
Per wife Pt had dental appt yesterday. Today he has a fever and HR is up   Please call [phone]  thanks renee

## 2020-01-06 NOTE — Telephone Encounter (Signed)
Thank you for the update. He had antibiotic prophylaxis prior to his dental procedure, so the chance of infection/endocarditis still remains extremely low. It sounds like he is back at baseline. They might check his temperature for the next couple of days, but if everything remains normal we will continue with current medical therapy and follow-up.

## 2020-01-07 DIAGNOSIS — Z9889 Other specified postprocedural states: Secondary | ICD-10-CM | POA: Diagnosis not present

## 2020-01-07 DIAGNOSIS — R05 Cough: Secondary | ICD-10-CM | POA: Diagnosis not present

## 2020-01-07 DIAGNOSIS — R509 Fever, unspecified: Secondary | ICD-10-CM | POA: Diagnosis not present

## 2020-01-11 DIAGNOSIS — Z6826 Body mass index (BMI) 26.0-26.9, adult: Secondary | ICD-10-CM | POA: Diagnosis not present

## 2020-01-11 DIAGNOSIS — R5383 Other fatigue: Secondary | ICD-10-CM | POA: Diagnosis not present

## 2020-02-01 DIAGNOSIS — K625 Hemorrhage of anus and rectum: Secondary | ICD-10-CM | POA: Diagnosis not present

## 2020-02-01 DIAGNOSIS — R5383 Other fatigue: Secondary | ICD-10-CM | POA: Diagnosis not present

## 2020-03-03 ENCOUNTER — Encounter (INDEPENDENT_AMBULATORY_CARE_PROVIDER_SITE_OTHER): Payer: Self-pay | Admitting: Gastroenterology

## 2020-03-08 ENCOUNTER — Other Ambulatory Visit: Payer: Self-pay | Admitting: Cardiothoracic Surgery

## 2020-03-08 DIAGNOSIS — I359 Nonrheumatic aortic valve disorder, unspecified: Secondary | ICD-10-CM

## 2020-03-14 ENCOUNTER — Other Ambulatory Visit: Payer: Self-pay

## 2020-03-14 ENCOUNTER — Ambulatory Visit (HOSPITAL_COMMUNITY)
Admission: RE | Admit: 2020-03-14 | Discharge: 2020-03-14 | Disposition: A | Discharge status: Home / Self Care | Payer: Medicare Other | Source: Ambulatory Visit | Attending: Cardiothoracic Surgery | Admitting: Cardiothoracic Surgery

## 2020-03-14 DIAGNOSIS — I359 Nonrheumatic aortic valve disorder, unspecified: Secondary | ICD-10-CM | POA: Diagnosis not present

## 2020-03-14 LAB — ECHOCARDIOGRAM COMPLETE
AR max vel: 3.96 cm2
AV Area VTI: 4.01 cm2
AV Area mean vel: 4.37 cm2
AV Mean grad: 2.1 mmHg
AV Peak grad: 4.9 mmHg
Ao pk vel: 1.1 m/s
Area-P 1/2: 3.37 cm2
S' Lateral: 2.42 cm

## 2020-03-14 NOTE — Progress Notes (Signed)
*  PRELIMINARY RESULTS* Echocardiogram 2D Echocardiogram has been performed.  Henry Hayden 03/14/2020, 9:21 AM

## 2020-03-17 DIAGNOSIS — H25011 Cortical age-related cataract, right eye: Secondary | ICD-10-CM | POA: Diagnosis not present

## 2020-03-17 DIAGNOSIS — H25811 Combined forms of age-related cataract, right eye: Secondary | ICD-10-CM | POA: Diagnosis not present

## 2020-03-17 DIAGNOSIS — H40013 Open angle with borderline findings, low risk, bilateral: Secondary | ICD-10-CM | POA: Diagnosis not present

## 2020-03-17 DIAGNOSIS — H524 Presbyopia: Secondary | ICD-10-CM | POA: Diagnosis not present

## 2020-04-04 ENCOUNTER — Ambulatory Visit (INDEPENDENT_AMBULATORY_CARE_PROVIDER_SITE_OTHER): Payer: Medicare Other | Admitting: Gastroenterology

## 2020-04-04 ENCOUNTER — Other Ambulatory Visit: Payer: Self-pay

## 2020-04-04 ENCOUNTER — Encounter (INDEPENDENT_AMBULATORY_CARE_PROVIDER_SITE_OTHER): Payer: Self-pay | Admitting: Gastroenterology

## 2020-04-04 DIAGNOSIS — K921 Melena: Secondary | ICD-10-CM | POA: Insufficient documentation

## 2020-04-04 DIAGNOSIS — K625 Hemorrhage of anus and rectum: Secondary | ICD-10-CM | POA: Diagnosis not present

## 2020-04-04 NOTE — Progress Notes (Signed)
Received a message from Dr. Domenic Polite stating the patient "should be able to proceed with EGD and colonoscopy with use of propofol at an acceptable cardiac risk". I will continue his aspirin the day of the procedure.   Maylon Peppers, MD Gastroenterology and Hepatology [facility]

## 2020-04-04 NOTE — Patient Instructions (Signed)
-   Schedule EGD and colonoscopy - will obtain clearance from cardiology to proceed with these procedures - Check CBC and iron stores

## 2020-04-04 NOTE — Progress Notes (Signed)
Maylon Peppers, M.D. Gastroenterology & Hepatology [facility] [address] [place], [place] [address] Primary Care Physician: Rory Percy, MD [place] [id number]  Referring MD: PCP and Clemmie Krill  I will communicate my assessment and recommendations to the referring MD via EMR. Note: Occasional unusual wording and randomly placed punctuation marks may result from the use of speech recognition technology to transcribe this document"  Chief Complaint: Rectal bleeding  History of Present Illness: Henry Hayden is a 69 y/o M with PMH type A aortic dissection s/p emergent repair, HTN, afib and MR, coming to the clinic for evaluation of rectal bleeding.  Patient reports that close to 2 months ago he presented rectal bleeding that lasted 3-4 days in a row and self-resolved. He states he saw fresh blood on top of his stools, and he had never had similar episodes in the past. He states that a little before he had these symptoms, he noticed some tarry stools. He states his PCP told him he was slightly anemic and had a FOBT testing performed, which came back positive.  Due to this, the patient had a Cologuard performed on 01/14/2020, however the test was not valid as the sample could not be processed.  Denies fever, chills, nausea, vomiting, abdominal pain/distention, hematemesis, or unintentional weight loss.    Patient reports he had his dissection repaired on 08/2019 at [facility]. He was severely anemic during this hospitalization and required a blood transfusion. He is currently following with cardiology, has a follow up appointment next week. 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  Last available labs from February showed severe anemia. He reports he had recent blood testing 2 months ago showing a Hb 13 but these tests are not available today. Last colonoscopy performed in  [place] 8 years ago, normal per patient.  Never had a an EGD.  Patient is not taking NSAIDs, AC or high dose antiplatelets.  FHx: non contributory SHx: non contributory Social: denies EtOH, smoking or illicit drug use  Past Medical History: Past Medical History:  Diagnosis Date  . Aortic dissection (HCC)    Type A  . Essential hypertension   . Mitral regurgitation   . Postoperative atrial fibrillation [facility])     Past Surgical History: Past Surgical History:  Procedure Laterality Date  . AORTIC VALVE REPAIR N/A 08/23/2019   Procedure: REPAIR OF TYPE A AORTIC DISSECTION USING HEMASHIELF PLATINUM 30MM VASCULAR GRAFT;  Surgeon: Wonda Olds, MD;  Location: [place];  Service: Open Heart Surgery;  Laterality: N/A;  . IR THORACENTESIS ASP PLEURAL SPACE W/IMG GUIDE  08/27/2019  . MITRAL VALVE REPAIR  08/23/2019   Procedure: Mitral Valve Repair (Mvr);  Surgeon: Wonda Olds, MD;  Location: [facility] OR;  Service: Open Heart Surgery;;    Family History: Family History  Problem Relation Age of Onset  . Atrial fibrillation Mother     Social History: Social History   Tobacco Use  Smoking Status Never Smoker  Smokeless Tobacco Current User  . Types: Chew   Social History   Substance and Sexual Activity  Alcohol Use Not Currently   Social History   Substance and Sexual Activity  Drug Use Never    Allergies: No Known Allergies  Medications: Current Outpatient Medications  Medication Sig Dispense Refill  . acetaminophen (TYLENOL) 160 MG/5ML liquid Take 500 mg by mouth in the  morning, at noon, and at bedtime.    Marland Kitchen aspirin EC 81 MG tablet Take 81 mg by mouth daily.    . Calcium Carbonate Antacid (TUMS PO) Take 1 tablet by mouth daily as needed (acid reflux/indigestion).    . Cholecalciferol (VITAMIN D3 PO) Take 1 tablet by mouth daily.    Marland Kitchen lisinopril (ZESTRIL) 20 MG tablet Take 1 tablet (20 mg total) by mouth daily. 30 tablet 0  . metoprolol succinate (TOPROL XL) 25 MG 24 hr  tablet Take 1 tablet (25 mg total) by mouth daily. 90 tablet 3  . Multiple Vitamin (MULTIVITAMIN WITH MINERALS) TABS tablet Take 1 tablet by mouth daily.    . Saw Palmetto, Serenoa repens, (SAW PALMETTO PO) Take 1 capsule by mouth daily.    Marland Kitchen CALCIUM PO Take 1 tablet by mouth daily. (Patient not taking: Reported on 04/04/2020)    . traMADol (ULTRAM) 50 MG tablet Take by mouth every 6 (six) hours as needed. (Patient not taking: Reported on 04/04/2020)     No current facility-administered medications for this visit.    Review of Systems: GENERAL: negative for malaise, night sweats HEENT: No changes in hearing or vision, no nose bleeds or other nasal problems. NECK: Negative for lumps, goiter, pain and significant neck swelling RESPIRATORY: Negative for cough, wheezing CARDIOVASCULAR: Negative for chest pain, leg swelling, palpitations, orthopnea GI: SEE HPI MUSCULOSKELETAL: Negative for joint pain or swelling, back pain, and muscle pain. SKIN: Negative for lesions, rash PSYCH: Negative for sleep disturbance, mood disorder and recent psychosocial stressors. HEMATOLOGY Negative for prolonged bleeding, bruising easily, and swollen nodes. ENDOCRINE: Negative for cold or heat intolerance, polyuria, polydipsia and goiter. NEURO: negative for tremor, gait imbalance, syncope and seizures. The remainder of the review of systems is noncontributory.   Physical Exam: BP 105/76 (BP Location: Right Arm, Patient Position: Sitting, Cuff Size: Normal)   Pulse 83   Temp 98.7 F (37.1 C) (Oral)   Ht 6' (1.829 m)   Wt 185 lb 11.2 oz (84.2 kg)   BMI 25.19 kg/m  GENERAL: The patient is AO x3, in no acute distress. HEENT: Head is normocephalic and atraumatic. EOMI are intact. Mouth is well hydrated and without lesions. NECK: Supple. No masses LUNGS: Clear to auscultation. No presence of rhonchi/wheezing/rales. Adequate chest expansion HEART: RRR, normal s1 and s2. ABDOMEN: Soft, nontender, no guarding,  no peritoneal signs, and nondistended. BS +. No masses. EXTREMITIES: Without any cyanosis, clubbing, rash, lesions or edema. NEUROLOGIC: AOx3, no focal motor deficit. SKIN: no jaundice, no rashes   Imaging/Labs: as above  I personally reviewed and interpreted the available labs, imaging and endoscopic files.  Impression and Plan: Henry Hayden is a 69 y/o M with PMH type A aortic dissection s/p emergent repair, HTN, afib and MR, coming to the clinic for evaluation of rectal bleeding. The patient also presented possible episodes of melena recently. It is unknown if he had significant drop of his Hb with these episodes of gastrointestinal bleeding, but he has not presented any episodes of orthostasis or symptomatic anemia. We will check his CBC ad iron stores at this moment. Differential for episodes of predominant rectal bleeding include hemorrhoids vs diverticular vs AVM, less likely due to malignancy. We will proceed with colonoscopy, also an EGD will be performed given possible melena episodes. We will obtain cardiology clearance given history of type A aortic dissection s/p repair.  - Schedule EGD and colonoscopy - will obtain clearance from cardiology to proceed with these procedures -  Check CBC and iron stores  All questions were answered.      Maylon Peppers, MD Gastroenterology and Hepatology [facility]

## 2020-04-05 ENCOUNTER — Encounter (INDEPENDENT_AMBULATORY_CARE_PROVIDER_SITE_OTHER): Payer: Self-pay | Admitting: *Deleted

## 2020-04-05 ENCOUNTER — Other Ambulatory Visit (INDEPENDENT_AMBULATORY_CARE_PROVIDER_SITE_OTHER): Payer: Self-pay | Admitting: *Deleted

## 2020-04-05 ENCOUNTER — Telehealth (INDEPENDENT_AMBULATORY_CARE_PROVIDER_SITE_OTHER): Payer: Self-pay | Admitting: *Deleted

## 2020-04-05 DIAGNOSIS — K625 Hemorrhage of anus and rectum: Secondary | ICD-10-CM | POA: Diagnosis not present

## 2020-04-05 DIAGNOSIS — K921 Melena: Secondary | ICD-10-CM | POA: Diagnosis not present

## 2020-04-05 MED ORDER — PLENVU 140 G PO SOLR: 1.0000 | Freq: Once | ORAL | 0 refills | Status: AC

## 2020-04-05 NOTE — Telephone Encounter (Signed)
Patient needs Plenvu (copay card) ° °

## 2020-04-06 LAB — CBC WITH DIFFERENTIAL/PLATELET
Absolute Monocytes: 418 cells/uL (ref 200–950)
Basophils Absolute: 51 cells/uL (ref 0–200)
Basophils Relative: 1 %
Eosinophils Absolute: 51 cells/uL (ref 15–500)
Eosinophils Relative: 1 %
HCT: 42.4 % (ref 38.5–50.0)
Hemoglobin: 14 g/dL (ref 13.2–17.1)
Lymphs Abs: 1525 cells/uL (ref 850–3900)
MCH: 30.7 pg (ref 27.0–33.0)
MCHC: 33 g/dL (ref 32.0–36.0)
MCV: 93 fL (ref 80.0–100.0)
MPV: 9.5 fL (ref 7.5–12.5)
Monocytes Relative: 8.2 %
Neutro Abs: 3055 cells/uL (ref 1500–7800)
Neutrophils Relative %: 59.9 %
Platelets: 213 10*3/uL (ref 140–400)
RBC: 4.56 10*6/uL (ref 4.20–5.80)
RDW: 14.1 % (ref 11.0–15.0)
Total Lymphocyte: 29.9 %
WBC: 5.1 10*3/uL (ref 3.8–10.8)

## 2020-04-06 LAB — FERRITIN: Ferritin: 42 ng/mL (ref 24–380)

## 2020-04-06 LAB — IRON, TOTAL/TOTAL IRON BINDING CAP
%SAT: 42 % (calc) (ref 20–48)
Iron: 109 ug/dL (ref 50–180)
TIBC: 261 mcg/dL (calc) (ref 250–425)

## 2020-04-11 ENCOUNTER — Ambulatory Visit: Payer: Medicare Other | Admitting: Cardiothoracic Surgery

## 2020-04-13 ENCOUNTER — Ambulatory Visit (INDEPENDENT_AMBULATORY_CARE_PROVIDER_SITE_OTHER): Payer: Medicare Other | Admitting: Cardiology

## 2020-04-13 ENCOUNTER — Encounter: Payer: Self-pay | Admitting: Cardiology

## 2020-04-13 ENCOUNTER — Other Ambulatory Visit: Payer: Self-pay

## 2020-04-13 VITALS — BP 100/68 | HR 90 | Ht 72.0 in | Wt 181.0 lb

## 2020-04-13 DIAGNOSIS — R000 Tachycardia, unspecified: Secondary | ICD-10-CM

## 2020-04-13 DIAGNOSIS — I4729 Other ventricular tachycardia: Secondary | ICD-10-CM

## 2020-04-13 DIAGNOSIS — Z8679 Personal history of other diseases of the circulatory system: Secondary | ICD-10-CM

## 2020-04-13 DIAGNOSIS — I340 Nonrheumatic mitral (valve) insufficiency: Secondary | ICD-10-CM | POA: Diagnosis not present

## 2020-04-13 NOTE — Progress Notes (Signed)
Cardiology Office Note  Date: 04/13/2020   ID: [name], DOB 14-Aug-1950, MRN [id number]  PCP:  Rory Percy, MD  Cardiologist:  Rozann Lesches, MD Electrophysiologist:  None   Chief Complaint  Patient presents with  . Cardiac follow-up    History of Present Illness: Henry Hayden is a 69 y.o. male last seen in April.  He is here today with his wife for a follow-up visit.  Overall, doing well in terms of stamina, no exertional chest pain or unusual breathlessness.  Also no sense of palpitations or dizziness.  I went over home blood pressure and heart rate checks, looks good in general but he did have a few days back in early August where his heart rate was elevated at rest, 100-110 range.  He does not report any palpitations or symptoms associated with that time.  He states that he has been taking all of his medications regularly.  He does have a history of postoperative atrial fibrillation.  Heart rate is regular today.  Past Medical History:  Diagnosis Date  . Aortic dissection (HCC)    Type A  . Essential hypertension   . Mitral regurgitation   . Postoperative atrial fibrillation [facility])     Past Surgical History:  Procedure Laterality Date  . AORTIC VALVE REPAIR N/A 08/23/2019   Procedure: REPAIR OF TYPE A AORTIC DISSECTION USING HEMASHIELF PLATINUM 30MM VASCULAR GRAFT;  Surgeon: Wonda Olds, MD;  Location: [place];  Service: Open Heart Surgery;  Laterality: N/A;  . IR THORACENTESIS ASP PLEURAL SPACE W/IMG GUIDE  08/27/2019  . MITRAL VALVE REPAIR  08/23/2019   Procedure: Mitral Valve Repair (Mvr);  Surgeon: Wonda Olds, MD;  Location: [facility] OR;  Service: Open Heart Surgery;;    Current Outpatient Medications  Medication Sig Dispense Refill  . aspirin EC 81 MG tablet Take 81 mg by mouth daily.    . calcium elemental as carbonate (TUMS ULTRA 1000) 400 MG chewable tablet Chew 1,000 mg by mouth daily.    . Cholecalciferol (VITAMIN D) 50 MCG (2000 UT) tablet Take  2,000 Units by mouth daily.    Marland Kitchen lisinopril (ZESTRIL) 20 MG tablet Take 1 tablet (20 mg total) by mouth daily. 30 tablet 0  . metoprolol succinate (TOPROL XL) 25 MG 24 hr tablet Take 1 tablet (25 mg total) by mouth daily. 90 tablet 3  . Multiple Vitamin (MULTIVITAMIN WITH MINERALS) TABS tablet Take 1 tablet by mouth daily.    Marland Kitchen PREVIDENT 5000 BOOSTER PLUS 1.1 % PSTE Place 1 application onto teeth 2 (two) times a week.     . Saw Palmetto 450 MG CAPS Take 450 mg by mouth daily.     No current facility-administered medications for this visit.   Allergies:  Patient has no known allergies.   ROS:  No syncope.  Physical Exam: VS:  BP 100/68   Pulse 90   Ht 6' (1.829 m)   Wt 181 lb (82.1 kg)   SpO2 93%   BMI 24.55 kg/m , BMI Body mass index is 24.55 kg/m.  Wt Readings from Last 3 Encounters:  04/13/20 181 lb (82.1 kg)  04/04/20 185 lb 11.2 oz (84.2 kg)  10/27/19 185 lb (83.9 kg)    General: Patient appears comfortable at rest. HEENT: Conjunctiva and lids normal, wearing a mask. Neck: Supple, no elevated JVP or carotid bruits, no thyromegaly. Lungs: Clear to auscultation, nonlabored breathing at rest. Cardiac: Regular rate and rhythm, no S3, soft systolic murmur. Extremities: No  pitting edema, distal pulses 2+.  ECG:  An ECG dated 09/14/2019 was personally reviewed today and demonstrated:  Normal sinus rhythm.  Recent Labwork: 08/22/2019: ALT 24; AST 34 08/31/2019: BUN 17; Creatinine, Ser 1.07; Magnesium 2.0; Potassium 4.2; Sodium 133 04/05/2020: Hemoglobin 14.0; Platelets 213     Component Value Date/Time   CHOL 155 08/22/2019 2345   TRIG 110 08/22/2019 2345   HDL 47 08/22/2019 2345   CHOLHDL 3.3 08/22/2019 2345   VLDL 22 08/22/2019 2345   LDLCALC 86 08/22/2019 2345    Other Studies Reviewed Today:  Echocardiogram 09/17/2019: 1. Left ventricular ejection fraction, by estimation, is 60 to 65%. The  left ventricle has normal function. The left ventricle has no regional  wall  motion abnormalities. There is mild left ventricular hypertrophy.  Left ventricular diastolic parameters  were normal.  2. Right ventricular systolic function is normal. The right ventricular  size is mildly enlarged. There is normal pulmonary artery systolic  pressure.  3. Hockey stick formation of the anterior mitral valve leaflet in a  pattern commonly seen in rheumatic heart disease. From record review  history of MV repair with edge-to-edge approximation of A2 scallop to P2  scallop which would actually be the cause  of this morpholoical appearance in this case and not rheumatic disease.  Mild mean gradient across the valve of 3 mmHg. . The mitral valve is  abnormal. Trivial mitral valve regurgitation.  4. The aortic valve has an indeterminant number of cusps. Aortic valve  regurgitation is not visualized. No aortic stenosis is present.  5. The inferior vena cava is normal in size with greater than 50%  respiratory variability, suggesting right atrial pressure of 3 mmHg.   Assessment and Plan:  1.  History of type A aortic dissection status post emergent repair with 30 mm Hemashield graft.  2.  Essential hypertension, overall blood pressure control is adequate on present regimen including Zestril and Toprol-XL.  3.  History of postoperative atrial fibrillation.  72-hour cardiac monitor being provided for brief screening.  His recent home blood pressure and heart rate checks look good however.  I have explained that if he does have any episodes where his heart rate is over 100 bpm at rest and persists, he should come in for an ECG.  4.  Status post mitral valve repair with Alfieri stitch at the time of type A aortic dissection repair.  Follow-up echocardiogram shows trivial mitral regurgitation at this time.  Medication Adjustments/Labs and Tests Ordered: Current medicines are reviewed at length with the patient today.  Concerns regarding medicines are outlined above.   Tests  Ordered: Orders Placed This Encounter  Procedures  . LONG TERM MONITOR-LIVE TELEMETRY (3-14 DAYS)    Medication Changes: No orders of the defined types were placed in this encounter.   Disposition:  Follow up 6 months in the [place] office.  Signed, Satira Sark, MD, [facility] 04/13/2020 3:05 PM    [place] [facility] [address] [place], [place] [address] Phone: [phone]; Fax: [phone]

## 2020-04-13 NOTE — Patient Instructions (Signed)
Your physician wants you to follow-up in: [place] will receive a reminder letter in the mail two months in advance. If you don't receive a letter, please call our office to schedule the follow-up appointment.  Your physician recommends that you continue on your current medications as directed. Please refer to the Current Medication list given to you today.  [place]  Thank you for choosing [facility]!!

## 2020-04-18 ENCOUNTER — Other Ambulatory Visit (HOSPITAL_COMMUNITY): Payer: Medicare Other

## 2020-04-27 ENCOUNTER — Other Ambulatory Visit: Payer: Self-pay

## 2020-04-27 ENCOUNTER — Ambulatory Visit (INDEPENDENT_AMBULATORY_CARE_PROVIDER_SITE_OTHER): Payer: Medicare Other

## 2020-04-27 DIAGNOSIS — Z8679 Personal history of other diseases of the circulatory system: Secondary | ICD-10-CM | POA: Diagnosis not present

## 2020-04-27 DIAGNOSIS — R000 Tachycardia, unspecified: Secondary | ICD-10-CM

## 2020-04-27 NOTE — Addendum Note (Signed)
Addended by: Levonne Hubert on: 04/27/2020 04:03 PM   Modules accepted: Orders

## 2020-05-05 DIAGNOSIS — H40023 Open angle with borderline findings, high risk, bilateral: Secondary | ICD-10-CM | POA: Diagnosis not present

## 2020-05-09 NOTE — Patient Instructions (Signed)
Henry Hayden  05/09/2020     @PREFPERIOPPHARMACY @   Your procedure is scheduled on  05/13/2020.  Report to [facility] at  0700  A.M.  Call this number if you have problems the morning of surgery:  [phone]   Remember:   Follow the diet and prep instructions given to you by the office.                       Take these medicines the morning of surgery with A SIP OF WATER  Metoprolol.    Do not wear jewelry, make-up or nail polish.  Do not wear lotions, powders, or perfumes. Please wear deodorant and brush your teeth.  Do not shave 48 hours prior to surgery.  Men may shave face and neck.  Do not bring valuables to the hospital.  [facility] is not responsible for any belongings or valuables.  Contacts, dentures or bridgework may not be worn into surgery.  Leave your suitcase in the car.  After surgery it may be brought to your room.  For patients admitted to the hospital, discharge time will be determined by your treatment team.  Patients discharged the day of surgery will not be allowed to drive home.   Name and phone number of your driver:   family Special instructions:   DO NOT smoke the morning of your procedure.  Please read over the following fact sheets that you were given. Anesthesia Post-op Instructions and Care and Recovery After Surgery       Upper Endoscopy, Adult, Care After This sheet gives you information about how to care for yourself after your procedure. Your health care provider may also give you more specific instructions. If you have problems or questions, contact your health care provider. What can I expect after the procedure? After the procedure, it is common to have:  A sore throat.  Mild stomach pain or discomfort.  Bloating.  Nausea. Follow these instructions at home:   Follow instructions from your health care provider about what to eat or drink after your procedure.  Return to your normal activities as told by your  health care provider. Ask your health care provider what activities are safe for you.  Take over-the-counter and prescription medicines only as told by your health care provider.  Do not drive for 24 hours if you were given a sedative during your procedure.  Keep all follow-up visits as told by your health care provider. This is important. Contact a health care provider if you have:  A sore throat that lasts longer than one day.  Trouble swallowing. Get help right away if:  You vomit blood or your vomit looks like coffee grounds.  You have: ? A fever. ? Bloody, black, or tarry stools. ? A severe sore throat or you cannot swallow. ? Difficulty breathing. ? Severe pain in your chest or abdomen. Summary  After the procedure, it is common to have a sore throat, mild stomach discomfort, bloating, and nausea.  Do not drive for 24 hours if you were given a sedative during the procedure.  Follow instructions from your health care provider about what to eat or drink after your procedure.  Return to your normal activities as told by your health care provider. This information is not intended to replace advice given to you by your health care provider. Make sure you discuss any questions you have with your health care provider. Document  Revised: 12/24/2017 Document Reviewed: 12/02/2017 Elsevier Patient Education  [place].  Colonoscopy, Adult, Care After This sheet gives you information about how to care for yourself after your procedure. Your health care provider may also give you more specific instructions. If you have problems or questions, contact your health care provider. What can I expect after the procedure? After the procedure, it is common to have:  A small amount of blood in your stool for 24 hours after the procedure.  Some gas.  Mild cramping or bloating of your abdomen. Follow these instructions at home: Eating and drinking   Drink enough fluid to keep  your urine pale yellow.  Follow instructions from your health care provider about eating or drinking restrictions.  Resume your normal diet as instructed by your health care provider. Avoid heavy or fried foods that are hard to digest. Activity  Rest as told by your health care provider.  Avoid sitting for a long time without moving. Get up to take short walks every 1-2 hours. This is important to improve blood flow and breathing. Ask for help if you feel weak or unsteady.  Return to your normal activities as told by your health care provider. Ask your health care provider what activities are safe for you. Managing cramping and bloating   Try walking around when you have cramps or feel bloated.  Apply heat to your abdomen as told by your health care provider. Use the heat source that your health care provider recommends, such as a moist heat pack or a heating pad. ? Place a towel between your skin and the heat source. ? Leave the heat on for 20-30 minutes. ? Remove the heat if your skin turns bright red. This is especially important if you are unable to feel pain, heat, or cold. You may have a greater risk of getting burned. General instructions  For the first 24 hours after the procedure: ? Do not drive or use machinery. ? Do not sign important documents. ? Do not drink alcohol. ? Do your regular daily activities at a slower pace than normal. ? Eat soft foods that are easy to digest.  Take over-the-counter and prescription medicines only as told by your health care provider.  Keep all follow-up visits as told by your health care provider. This is important. Contact a health care provider if:  You have blood in your stool 2-3 days after the procedure. Get help right away if you have:  More than a small spotting of blood in your stool.  Large blood clots in your stool.  Swelling of your abdomen.  Nausea or vomiting.  A fever.  Increasing pain in your abdomen that is not  relieved with medicine. Summary  After the procedure, it is common to have a small amount of blood in your stool. You may also have mild cramping and bloating of your abdomen.  For the first 24 hours after the procedure, do not drive or use machinery, sign important documents, or drink alcohol.  Get help right away if you have a lot of blood in your stool, nausea or vomiting, a fever, or increased pain in your abdomen. This information is not intended to replace advice given to you by your health care provider. Make sure you discuss any questions you have with your health care provider. Document Revised: 01/26/2019 Document Reviewed: 01/26/2019 Elsevier Patient Education  [place] After These instructions provide you with information about caring for yourself  after your procedure. Your health care provider may also give you more specific instructions. Your treatment has been planned according to current medical practices, but problems sometimes occur. Call your health care provider if you have any problems or questions after your procedure. What can I expect after the procedure? After your procedure, you may:  Feel sleepy for several hours.  Feel clumsy and have poor balance for several hours.  Feel forgetful about what happened after the procedure.  Have poor judgment for several hours.  Feel nauseous or vomit.  Have a sore throat if you had a breathing tube during the procedure. Follow these instructions at home: For at least 24 hours after the procedure:      Have a responsible adult stay with you. It is important to have someone help care for you until you are awake and alert.  Rest as needed.  Do not: ? Participate in activities in which you could fall or become injured. ? Drive. ? Use heavy machinery. ? Drink alcohol. ? Take sleeping pills or medicines that cause drowsiness. ? Make important decisions or sign legal  documents. ? Take care of children on your own. Eating and drinking  Follow the diet that is recommended by your health care provider.  If you vomit, drink water, juice, or soup when you can drink without vomiting.  Make sure you have little or no nausea before eating solid foods. General instructions  Take over-the-counter and prescription medicines only as told by your health care provider.  If you have sleep apnea, surgery and certain medicines can increase your risk for breathing problems. Follow instructions from your health care provider about wearing your sleep device: ? Anytime you are sleeping, including during daytime naps. ? While taking prescription pain medicines, sleeping medicines, or medicines that make you drowsy.  If you smoke, do not smoke without supervision.  Keep all follow-up visits as told by your health care provider. This is important. Contact a health care provider if:  You keep feeling nauseous or you keep vomiting.  You feel light-headed.  You develop a rash.  You have a fever. Get help right away if:  You have trouble breathing. Summary  For several hours after your procedure, you may feel sleepy and have poor judgment.  Have a responsible adult stay with you for at least 24 hours or until you are awake and alert. This information is not intended to replace advice given to you by your health care provider. Make sure you discuss any questions you have with your health care provider. Document Revised: 09/30/2017 Document Reviewed: 10/23/2015 Elsevier Patient Education  [place].

## 2020-05-11 ENCOUNTER — Encounter (HOSPITAL_COMMUNITY)
Admission: RE | Admit: 2020-05-11 | Discharge: 2020-05-11 | Disposition: A | Discharge status: Home / Self Care | Payer: Medicare Other | Source: Ambulatory Visit | Attending: Gastroenterology | Admitting: Gastroenterology

## 2020-05-11 ENCOUNTER — Other Ambulatory Visit (HOSPITAL_COMMUNITY)
Admission: RE | Admit: 2020-05-11 | Discharge: 2020-05-11 | Disposition: A | Discharge status: Home / Self Care | Payer: Medicare Other | Source: Ambulatory Visit | Attending: Gastroenterology | Admitting: Gastroenterology

## 2020-05-11 ENCOUNTER — Encounter (HOSPITAL_COMMUNITY): Payer: Self-pay

## 2020-05-11 ENCOUNTER — Other Ambulatory Visit: Payer: Self-pay

## 2020-05-11 DIAGNOSIS — Z20822 Contact with and (suspected) exposure to covid-19: Secondary | ICD-10-CM | POA: Insufficient documentation

## 2020-05-11 DIAGNOSIS — Z01812 Encounter for preprocedural laboratory examination: Secondary | ICD-10-CM | POA: Insufficient documentation

## 2020-05-11 LAB — SARS CORONAVIRUS 2 (TAT 6-24 HRS): SARS Coronavirus 2: NEGATIVE

## 2020-05-13 ENCOUNTER — Other Ambulatory Visit: Payer: Self-pay

## 2020-05-13 ENCOUNTER — Encounter (HOSPITAL_COMMUNITY): Payer: Self-pay | Admitting: Gastroenterology

## 2020-05-13 ENCOUNTER — Ambulatory Visit (HOSPITAL_COMMUNITY)
Admission: RE | Admit: 2020-05-13 | Discharge: 2020-05-13 | Disposition: A | Discharge status: Home / Self Care | Payer: Medicare Other | Attending: Gastroenterology | Admitting: Gastroenterology

## 2020-05-13 ENCOUNTER — Ambulatory Visit (HOSPITAL_COMMUNITY): Payer: Medicare Other | Admitting: Anesthesiology

## 2020-05-13 ENCOUNTER — Encounter (HOSPITAL_COMMUNITY)
Admission: RE | Disposition: A | Discharge status: Home / Self Care | Payer: Self-pay | Source: Home / Self Care | Attending: Gastroenterology

## 2020-05-13 DIAGNOSIS — K2100 Gastro-esophageal reflux disease with esophagitis, without bleeding: Secondary | ICD-10-CM

## 2020-05-13 DIAGNOSIS — K2970 Gastritis, unspecified, without bleeding: Secondary | ICD-10-CM | POA: Diagnosis not present

## 2020-05-13 DIAGNOSIS — K648 Other hemorrhoids: Secondary | ICD-10-CM

## 2020-05-13 DIAGNOSIS — D123 Benign neoplasm of transverse colon: Secondary | ICD-10-CM | POA: Diagnosis not present

## 2020-05-13 DIAGNOSIS — K219 Gastro-esophageal reflux disease without esophagitis: Secondary | ICD-10-CM | POA: Diagnosis not present

## 2020-05-13 DIAGNOSIS — K2950 Unspecified chronic gastritis without bleeding: Secondary | ICD-10-CM | POA: Insufficient documentation

## 2020-05-13 DIAGNOSIS — K5730 Diverticulosis of large intestine without perforation or abscess without bleeding: Secondary | ICD-10-CM | POA: Diagnosis not present

## 2020-05-13 DIAGNOSIS — K921 Melena: Secondary | ICD-10-CM

## 2020-05-13 DIAGNOSIS — K625 Hemorrhage of anus and rectum: Secondary | ICD-10-CM

## 2020-05-13 DIAGNOSIS — K3189 Other diseases of stomach and duodenum: Secondary | ICD-10-CM

## 2020-05-13 DIAGNOSIS — D125 Benign neoplasm of sigmoid colon: Secondary | ICD-10-CM | POA: Insufficient documentation

## 2020-05-13 HISTORY — PX: POLYPECTOMY: SHX5525

## 2020-05-13 HISTORY — PX: BIOPSY: SHX5522

## 2020-05-13 HISTORY — PX: COLONOSCOPY WITH PROPOFOL: SHX5780

## 2020-05-13 HISTORY — PX: ESOPHAGOGASTRODUODENOSCOPY (EGD) WITH PROPOFOL: SHX5813

## 2020-05-13 LAB — HM COLONOSCOPY

## 2020-05-13 SURGERY — COLONOSCOPY WITH PROPOFOL
Anesthesia: General

## 2020-05-13 MED ORDER — STERILE WATER FOR IRRIGATION IR SOLN
Status: DC | PRN
  Administered 2020-05-13: 100 mL

## 2020-05-13 MED ORDER — EPHEDRINE SULFATE 50 MG/ML IJ SOLN
INTRAMUSCULAR | Status: DC | PRN
  Administered 2020-05-13 (×3): 10 mg via INTRAVENOUS

## 2020-05-13 MED ORDER — LIDOCAINE VISCOUS HCL 2 % MT SOLN
OROMUCOSAL | Status: AC
  Filled 2020-05-13: qty 30

## 2020-05-13 MED ORDER — PROPOFOL 500 MG/50ML IV EMUL
INTRAVENOUS | Status: DC | PRN
  Administered 2020-05-13: 100 ug/kg/min via INTRAVENOUS

## 2020-05-13 MED ORDER — LACTATED RINGERS IV SOLN: INTRAVENOUS | Status: DC

## 2020-05-13 MED ORDER — OMEPRAZOLE 40 MG PO CPDR: 40.0000 mg | DELAYED_RELEASE_CAPSULE | Freq: Every day | ORAL | 3 refills | Status: AC

## 2020-05-13 MED ORDER — LIDOCAINE 2% (20 MG/ML) 5 ML SYRINGE
INTRAMUSCULAR | Status: AC
  Filled 2020-05-13: qty 5

## 2020-05-13 MED ORDER — PROPOFOL 10 MG/ML IV BOLUS
INTRAVENOUS | Status: AC
  Filled 2020-05-13: qty 40

## 2020-05-13 MED ORDER — LIDOCAINE HCL (CARDIAC) PF 100 MG/5ML IV SOSY
PREFILLED_SYRINGE | INTRAVENOUS | Status: DC | PRN
  Administered 2020-05-13: 100 mg via INTRAVENOUS

## 2020-05-13 MED ORDER — LACTATED RINGERS IV SOLN: INTRAVENOUS | Status: DC | PRN

## 2020-05-13 MED ORDER — PROPOFOL 10 MG/ML IV BOLUS
INTRAVENOUS | Status: AC
  Filled 2020-05-13: qty 20

## 2020-05-13 MED ORDER — PROPOFOL 10 MG/ML IV BOLUS
INTRAVENOUS | Status: DC | PRN
  Administered 2020-05-13: 50 mg via INTRAVENOUS

## 2020-05-13 MED ORDER — PHENYLEPHRINE HCL (PRESSORS) 10 MG/ML IV SOLN
INTRAVENOUS | Status: DC | PRN
  Administered 2020-05-13 (×2): 200 ug via INTRAVENOUS
  Administered 2020-05-13: 100 ug via INTRAVENOUS
  Administered 2020-05-13 (×3): 200 ug via INTRAVENOUS
  Administered 2020-05-13 (×3): 100 ug via INTRAVENOUS

## 2020-05-13 NOTE — Anesthesia Postprocedure Evaluation (Signed)
Anesthesia Post Note  Patient: Tandy Lewin  Procedure(s) Performed: COLONOSCOPY WITH PROPOFOL (N/A ) ESOPHAGOGASTRODUODENOSCOPY (EGD) WITH PROPOFOL (N/A ) BIOPSY POLYPECTOMY  Patient location during evaluation: PACU Anesthesia Type: General Level of consciousness: awake, awake and alert, oriented and patient cooperative Pain management: satisfactory to patient Vital Signs Assessment: post-procedure vital signs reviewed and stable Respiratory status: spontaneous breathing, nonlabored ventilation, respiratory function stable and patient connected to nasal cannula oxygen Anesthetic complications: no   No complications documented.   Last Vitals:  Vitals:   05/13/20 0748  BP: 112/84  Pulse: 78  Resp: 19  Temp: 36.6 C  SpO2: 98%    Last Pain:  Vitals:   05/13/20 0843  TempSrc:   PainSc: 0-No pain                 Anamari Galeas

## 2020-05-13 NOTE — H&P (Addendum)
Henry Hayden is an 69 y.o. male.   Chief Complaint: Rectal bleeding and melena HPI: 69 y/o M with PMH type A aortic dissection s/p emergent repair, HTN, afib and MR, coming to the hospital for evaluation of rectal bleeding and melena.  The patient presented 2 months ago an episode of rectal bleeding that lasted for close to 4 days in a row resolved on its own, reported having fresh blood on top of his stool.  Prior to this he had some episodes of melena.  He was found to have anemia by his PCP with a positive FOBT.  Has not presented any more episodes of rectal bleeding or melena since then.  Denies taking NSAIDs or anticoagulants/antiplatelets.  Last colonoscopy performed in [place] 8 years ago, normal per patient.  Never had a an EGD.  Past Medical History:  Diagnosis Date  . Aortic dissection (HCC)    Type A  . Essential hypertension   . Mitral regurgitation   . Postoperative atrial fibrillation [facility])     Past Surgical History:  Procedure Laterality Date  . AORTIC VALVE REPAIR N/A 08/23/2019   Procedure: REPAIR OF TYPE A AORTIC DISSECTION USING HEMASHIELF PLATINUM 30MM VASCULAR GRAFT;  Surgeon: Wonda Olds, MD;  Location: [place];  Service: Open Heart Surgery;  Laterality: N/A;  . IR THORACENTESIS ASP PLEURAL SPACE W/IMG GUIDE  08/27/2019  . MITRAL VALVE REPAIR  08/23/2019   Procedure: Mitral Valve Repair (Mvr);  Surgeon: Wonda Olds, MD;  Location: [facility] OR;  Service: Open Heart Surgery;;    Family History  Problem Relation Age of Onset  . Atrial fibrillation Mother    Social History:  reports that he has never smoked. His smokeless tobacco use includes chew. He reports previous alcohol use. He reports that he does not use drugs.  Allergies: No Known Allergies  Medications Prior to Admission  Medication Sig Dispense Refill  . aspirin EC 81 MG tablet Take 81 mg by mouth daily.    . calcium elemental as carbonate (TUMS ULTRA 1000) 400 MG chewable tablet Chew 1,000 mg by  mouth daily.    . Cholecalciferol (VITAMIN D) 50 MCG (2000 UT) tablet Take 2,000 Units by mouth daily.    Marland Kitchen lisinopril (ZESTRIL) 20 MG tablet Take 1 tablet (20 mg total) by mouth daily. 30 tablet 0  . metoprolol succinate (TOPROL XL) 25 MG 24 hr tablet Take 1 tablet (25 mg total) by mouth daily. 90 tablet 3  . Multiple Vitamin (MULTIVITAMIN WITH MINERALS) TABS tablet Take 1 tablet by mouth daily.    Marland Kitchen PREVIDENT 5000 BOOSTER PLUS 1.1 % PSTE Place 1 application onto teeth 2 (two) times a week.     . Saw Palmetto 450 MG CAPS Take 450 mg by mouth daily.      Results for orders placed or performed during the hospital encounter of 05/11/20 (from the past 48 hour(s))  SARS CORONAVIRUS 2 (TAT 6-24 HRS) Nasopharyngeal Nasopharyngeal Swab     Status: None   Collection Time: 05/11/20  8:42 AM   Specimen: Nasopharyngeal Swab  Result Value Ref Range   SARS Coronavirus 2 NEGATIVE NEGATIVE    Comment: (NOTE) SARS-CoV-2 target nucleic acids are NOT DETECTED.  The SARS-CoV-2 RNA is generally detectable in upper and lower respiratory specimens during the acute phase of infection. Negative results do not preclude SARS-CoV-2 infection, do not rule out co-infections with other pathogens, and should not be used as the sole basis for treatment or other patient management decisions.  Negative results must be combined with clinical observations, patient history, and epidemiological information. The expected result is Negative.  Fact Sheet for Patients: SugarRoll.be  Fact Sheet for Healthcare Providers: https://www.woods-mathews.com/  This test is not yet approved or cleared by the [place] FDA and  has been authorized for detection and/or diagnosis of SARS-CoV-2 by FDA under an Emergency Use Authorization (EUA). This EUA will remain  in effect (meaning this test can be used) for the duration of the COVID-19 declaration under Se ction 564(b)(1) of the Act, 21  U.S.C. section 360bbb-3(b)(1), unless the authorization is terminated or revoked sooner.  Performed at [facility], [place] [address], [place], [place] [address]    No results found.  Review of Systems  Constitutional: Negative.   HENT: Negative.   Eyes: Negative.   Respiratory: Negative.   Cardiovascular: Negative.   Gastrointestinal: Negative.   Endocrine: Negative.   Genitourinary: Negative.   Musculoskeletal: Negative.   Skin: Negative.   Allergic/Immunologic: Negative.   Neurological: Negative.   Hematological: Negative.   Psychiatric/Behavioral: Negative.     Blood pressure 112/84, pulse 78, temperature 97.8 F (36.6 C), temperature source Oral, resp. rate 19, SpO2 98 %. Physical Exam  GENERAL: The patient is AO x3, in no acute distress. HEENT: Head is normocephalic and atraumatic. EOMI are intact. Mouth is well hydrated and without lesions. NECK: Supple. No masses LUNGS: Clear to auscultation. No presence of rhonchi/wheezing/rales. Adequate chest expansion HEART: RRR, normal s1 and s2. ABDOMEN: Soft, nontender, no guarding, no peritoneal signs, and nondistended. BS +. No masses. EXTREMITIES: Without any cyanosis, clubbing, rash, lesions or edema. NEUROLOGIC: AOx3, no focal motor deficit. SKIN: no jaundice, no rashes\  Assessment/Plan 69 y/o M with PMH type A aortic dissection s/p emergent repair, HTN, afib and MR, coming to the hospital for evaluation of rectal bleeding and melena.  We will proceed with EGD and colonoscopy.  Harvel Quale, MD 05/13/2020, 8:38 AM

## 2020-05-13 NOTE — Transfer of Care (Signed)
Immediate Anesthesia Transfer of Care Note  Patient: Henry Hayden  Procedure(s) Performed: COLONOSCOPY WITH PROPOFOL (N/A ) ESOPHAGOGASTRODUODENOSCOPY (EGD) WITH PROPOFOL (N/A ) BIOPSY POLYPECTOMY  Patient Location: PACU  Anesthesia Type:General  Level of Consciousness: awake, alert , oriented and patient cooperative  Airway & Oxygen Therapy: Patient Spontanous Breathing and Patient connected to nasal cannula oxygen  Post-op Assessment: Report given to RN, Post -op Vital signs reviewed and stable and Patient moving all extremities X 4  Post vital signs: Reviewed and stable  Last Vitals:  Vitals Value Taken Time  BP 127/77 05/13/20 0945  Temp    Pulse 71 05/13/20 0948  Resp 20 05/13/20 0948  SpO2 100 % 05/13/20 0948  Vitals shown include unvalidated device data.  Last Pain:  Vitals:   05/13/20 0843  TempSrc:   PainSc: 0-No pain         Complications: No complications documented.

## 2020-05-13 NOTE — Anesthesia Preprocedure Evaluation (Signed)
Anesthesia Evaluation  Patient identified by MRN, date of birth, ID band Patient awake    Reviewed: Allergy & Precautions, NPO status , Patient's Chart, lab work & pertinent test results, reviewed documented beta blocker date and time   History of Anesthesia Complications Negative for: history of anesthetic complications  Airway Mallampati: II  TM Distance: >3 FB Neck ROM: Full    Dental no notable dental hx.    Pulmonary neg pulmonary ROS,    Pulmonary exam normal breath sounds clear to auscultation       Cardiovascular hypertension, Normal cardiovascular exam Rhythm:Regular Rate:Normal  EF 60-65%   Neuro/Psych    GI/Hepatic negative GI ROS, Neg liver ROS,   Endo/Other  negative endocrine ROS  Renal/GU negative Renal ROS     Musculoskeletal   Abdominal   Peds  Hematology   Anesthesia Other Findings   Reproductive/Obstetrics                             Anesthesia Physical Anesthesia Plan  ASA: II  Anesthesia Plan: General   Post-op Pain Management:    Induction: Intravenous  PONV Risk Score and Plan:   Airway Management Planned: Nasal Cannula  Additional Equipment:   Intra-op Plan:   Post-operative Plan:   Informed Consent: I have reviewed the patients History and Physical, chart, labs and discussed the procedure including the risks, benefits and alternatives for the proposed anesthesia with the patient or authorized representative who has indicated his/her understanding and acceptance.     Dental advisory given  Plan Discussed with: CRNA  Anesthesia Plan Comments:         Anesthesia Quick Evaluation

## 2020-05-13 NOTE — Op Note (Signed)
[facility] Patient Name: Henry Hayden Procedure Date: 05/13/2020 9:09 AM MRN: [id number] Date of Birth: 11-08-1950 Attending MD: Maylon Peppers ,  CSN: [id number] Age: 69 Admit Type: Outpatient Procedure:                Colonoscopy Indications:              Rectal bleeding Providers:                Maylon Peppers, Crystal Page, Nelma Rothman,                            Technician Referring MD:              Medicines:                Monitored Anesthesia Care Complications:            No immediate complications. Estimated Blood Loss:     Estimated blood loss: none. Procedure:                Pre-Anesthesia Assessment:                           - Prior to the procedure, a History and Physical                            was performed, and patient medications, allergies                            and sensitivities were reviewed. The patient's                            tolerance of previous anesthesia was reviewed.                           - The risks and benefits of the procedure and the                            sedation options and risks were discussed with the                            patient. All questions were answered and informed                            consent was obtained.                           - ASA Grade Assessment: III - A patient with severe                            systemic disease.                           After obtaining informed consent, the colonoscope                            was passed under direct vision. Throughout the  procedure, the patient's blood pressure, pulse, and                            oxygen saturations were monitored continuously. The                            [id number]-[id number] ([id number]) scope was introduced through                            the anus and advanced to the the terminal ileum.                            The colonoscopy was performed without difficulty.                            The patient  tolerated the procedure well. The                            quality of the bowel preparation was excellent.                            Scope withdrawal time was 14 minutes. Scope In: 9:12:24 AM Scope Out: 9:38:32 AM Scope Withdrawal Time: 0 hours 17 minutes 41 seconds  Total Procedure Duration: 0 hours 26 minutes 8 seconds  Findings:      Hemorrhoids were found on perianal exam.      The terminal ileum appeared normal.      A 3 mm polyp was found in the transverse colon. The polyp was sessile.       The polyp was removed with a cold biopsy forceps. Resection and       retrieval were complete.      A 5 mm polyp was found in the sigmoid colon. The polyp was sessile. The       polyp was removed with a cold snare. Resection and retrieval were       complete.      A few medium-mouthed diverticula were found in the sigmoid colon.      Non-bleeding internal hemorrhoids were found during retroflexion. The       hemorrhoids were moderate. Impression:               - Hemorrhoids found on perianal exam.                           - The examined portion of the ileum was normal.                           - One 3 mm polyp in the transverse colon, removed                            with a cold biopsy forceps. Resected and retrieved.                           - One 5 mm polyp in the sigmoid colon, removed with  a cold snare. Resected and retrieved.                           - Diverticulosis in the sigmoid colon.                           - Non-bleeding internal hemorrhoids. Moderate Sedation:      Per Anesthesia Care Recommendation:           - Discharge patient to home (ambulatory).                           - Resume previous diet.                           - Await pathology results.                           - Repeat colonoscopy date to be determined after                            pending pathology results are reviewed for                             surveillance. Procedure Code(s):        --- Professional ---                           [phone], GC, Colonoscopy, flexible; with removal of                            tumor(s), polyp(s), or other lesion(s) by snare                            technique                           99833, 62, Colonoscopy, flexible; with biopsy,                            single or multiple Diagnosis Code(s):        --- Professional ---                           K64.8, Other hemorrhoids                           K63.5, Polyp of colon                           K62.5, Hemorrhage of anus and rectum                           K57.30, Diverticulosis of large intestine without                            perforation or abscess without bleeding CPT copyright 2019 American [employer] Association. All rights reserved. The codes documented in  this report are preliminary and upon coder review may  be revised to meet current compliance requirements. Maylon Peppers, MD Maylon Peppers,  05/13/2020 9:50:01 AM This report has been signed electronically. Number of Addenda: 0

## 2020-05-13 NOTE — Discharge Instructions (Signed)
You are being discharged to home.  Resume your previous diet.  We are waiting for your pathology results.  Take Prilosec (omeprazole) 40 mg by mouth once a day.  Your physician has recommended a repeat colonoscopy (date to be determined after pending pathology results are reviewed) for surveillance.

## 2020-05-13 NOTE — Op Note (Signed)
[facility] Patient Name: Henry Hayden Procedure Date: 05/13/2020 8:43 AM MRN: [id number] Date of Birth: 1951-04-19 Attending MD: Maylon Peppers ,  CSN: [id number] Age: 69 Admit Type: Outpatient Procedure:                Upper GI endoscopy Indications:              Melena Providers:                Maylon Peppers, Crystal Page, Nelma Rothman,                            Technician Referring MD:              Medicines:                Monitored Anesthesia Care Complications:            No immediate complications. Estimated Blood Loss:     Estimated blood loss: none. Procedure:                Pre-Anesthesia Assessment:                           - Prior to the procedure, a History and Physical                            was performed, and patient medications, allergies                            and sensitivities were reviewed. The patient's                            tolerance of previous anesthesia was reviewed.                           - The risks and benefits of the procedure and the                            sedation options and risks were discussed with the                            patient. All questions were answered and informed                            consent was obtained.                           - ASA Grade Assessment: III - A patient with severe                            systemic disease.                           After obtaining informed consent, the endoscope was                            passed under direct vision. Throughout the  procedure, the patient's blood pressure, pulse, and                            oxygen saturations were monitored continuously. The                            [id number]) was introduced through the mouth,                            and advanced to the second part of duodenum. The                            upper GI endoscopy was accomplished without                            difficulty. The patient  tolerated the procedure                            well. Scope In: 8:49:41 AM Scope Out: 9:06:47 AM Total Procedure Duration: 0 hours 17 minutes 6 seconds  Findings:      LA Grade A (one or more mucosal breaks less than 5 mm, not extending       between tops of 2 mucosal folds) esophagitis with no bleeding was found       at the gastroesophageal junction. Biopsies were taken with a cold       forceps for histology, as there was presence of questionable nodularity       at the gastric level of the GE junction.      No other significant abnormalities were identified in a careful       examination of the esophagus.      Localized inflammation characterized by healing erosions and erythema       was found in the gastric antrum. Biopsies were taken with a cold forceps       for Helicobacter pylori testing.      Localized nodular mucosa was found in the gastric body. Biopsies were       taken with a cold forceps for histology.      The examined duodenum was normal. Impression:               - LA Grade A reflux esophagitis with no bleeding.                            Biopsied.                           - Gastritis. Biopsied.                           - Nodular mucosa in the gastric body. Biopsied.                           - Normal examined duodenum. Moderate Sedation:      Per Anesthesia Care Recommendation:           - Discharge patient to home (ambulatory).                           -  Resume previous diet.                           - Await pathology results.                           - Use Prilosec (omeprazole) 40 mg PO daily. Procedure Code(s):        --- Professional ---                           [phone], GC, Esophagogastroduodenoscopy, flexible,                            transoral; with biopsy, single or multiple Diagnosis Code(s):        --- Professional ---                           K21.00, Gastro-esophageal reflux disease with                            esophagitis, without  bleeding                           K29.70, Gastritis, unspecified, without bleeding                           K31.89, Other diseases of stomach and duodenum                           K92.1, Melena (includes Hematochezia) CPT copyright 2019 American Medical Association. All rights reserved. The codes documented in this report are preliminary and upon coder review may  be revised to meet current compliance requirements. Maylon Peppers, MD Maylon Peppers,  05/13/2020 9:45:43 AM This report has been signed electronically. Number of Addenda: 0

## 2020-05-16 ENCOUNTER — Other Ambulatory Visit: Payer: Self-pay

## 2020-05-16 LAB — SURGICAL PATHOLOGY

## 2020-05-19 ENCOUNTER — Encounter (HOSPITAL_COMMUNITY): Payer: Self-pay | Admitting: Gastroenterology

## 2020-06-21 DIAGNOSIS — L570 Actinic keratosis: Secondary | ICD-10-CM | POA: Diagnosis not present

## 2020-06-21 DIAGNOSIS — Z85828 Personal history of other malignant neoplasm of skin: Secondary | ICD-10-CM | POA: Diagnosis not present

## 2020-06-21 DIAGNOSIS — L821 Other seborrheic keratosis: Secondary | ICD-10-CM | POA: Diagnosis not present

## 2020-06-24 ENCOUNTER — Encounter (INDEPENDENT_AMBULATORY_CARE_PROVIDER_SITE_OTHER): Payer: Self-pay | Admitting: *Deleted

## 2020-12-08 ENCOUNTER — Other Ambulatory Visit: Payer: Self-pay | Admitting: Student

## 2020-12-19 NOTE — Progress Notes (Signed)
Cardiology Office Note  Date: 12/20/2020   ID: Henry Hayden, DOB 11-14-1950, MRN [id number]  PCP:  Lanelle Bal, PA-C  Cardiologist:  Rozann Lesches, MD Electrophysiologist:  None   Chief Complaint: 71-month follow-up  History of Present Illness: Henry Hayden is a 70 y.o. male with a history of nonrheumatic mitral valve regurgitation, history of aortic dissection, ventricular tachycardia with short coupling interval, tachycardia, history of atrial fibrillation.  He was last seen by Dr. Domenic Polite 04/13/2020 for follow-up visit.  He had been doing well in terms of stamina with no exertional chest pain or unusual breathing issues.  Denies any palpitations or dizziness.  He was taking all of his medications regularly.  History of postop atrial fibrillation, heart rate was regular that day.  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  Blood pressure control was adequate on present regimen including Zestril and Toprol-XL.  72-hour cardiac monitor was being provided for bradycardia screening and for history of postop atrial fibrillation.  He was advised if he had episodes of heart rates over 100 bpm at rest which persisted, he should come in for an EKG.  He is status post mitral valve repair with Alfieri stitch at the time of aortic dissection repair.  Follow-up echocardiogram showed trivial mitral regurgitation.  Cardiac monitor on 04/27/2020 showed no evidence of atrial fibrillation.   He is here today for 8-month follow-up.  He states he has been doing well from a cardiac standpoint.  His blood pressure is a little on the low side today but he is asymptomatic.  His wife brings with her a log of his blood pressures which seem to be running in the from the 412I to 786V systolic.  He denies any anginal or exertional symptoms, palpitations or arrhythmias, orthostatic symptoms, CVA or TIA-like symptoms, DOE or SOB, PND or orthopnea, bleeding issues.   Denies any claudication-like symptoms, DVT or PE-like symptoms, lower extremity edema.  States he has not had any recent lab work.  His wife states he does not like going to doctors.  EKG today demonstrates sinus bradycardia with a rate of 59.   Past Medical History:  Diagnosis Date  . Aortic dissection (HCC)    Type A  . Essential hypertension   . Mitral regurgitation   . Postoperative atrial fibrillation [facility])     Past Surgical History:  Procedure Laterality Date  . AORTIC VALVE REPAIR N/A 08/23/2019   Procedure: REPAIR OF TYPE A AORTIC DISSECTION USING HEMASHIELF PLATINUM 30MM VASCULAR GRAFT;  Surgeon: Wonda Olds, MD;  Location: [place];  Service: Open Heart Surgery;  Laterality: N/A;  . BIOPSY  05/13/2020   Procedure: BIOPSY;  Surgeon: Harvel Quale, MD;  [facility];  Service: Gastroenterology;;  . COLONOSCOPY WITH PROPOFOL N/A 05/13/2020   Procedure: COLONOSCOPY WITH PROPOFOL;  Surgeon: Harvel Quale, MD;  [facility];  Service: Gastroenterology;  Laterality: N/A;  915  . ESOPHAGOGASTRODUODENOSCOPY (EGD) WITH PROPOFOL N/A 05/13/2020   Procedure: ESOPHAGOGASTRODUODENOSCOPY (EGD) WITH PROPOFOL;  Surgeon: Harvel Quale, MD;  [facility];  Service: Gastroenterology;  Laterality: N/A;  . IR THORACENTESIS ASP PLEURAL SPACE W/IMG GUIDE  08/27/2019  . MITRAL VALVE REPAIR  08/23/2019   Procedure: Mitral Valve Repair (Mvr);  Surgeon: Wonda Olds, MD;  Location: [facility] OR;  Service: Open Heart Surgery;;  . POLYPECTOMY  05/13/2020   Procedure: POLYPECTOMY;  Surgeon: Harvel Quale, MD;  [facility];  Service: Gastroenterology;;    Current Outpatient Medications  Medication Sig Dispense Refill  . amoxicillin (AMOXIL) 500 MG capsule Take 4 capsules by mouth. Prior to dental work.    Marland Kitchen aspirin EC 81 MG tablet Take 81 mg by mouth daily.    . calcium elemental as carbonate (TUMS ULTRA 1000) 400 MG  chewable tablet Chew 1,000 mg by mouth daily.    . Cholecalciferol (VITAMIN D) 50 MCG (2000 UT) tablet Take 2,000 Units by mouth daily.    Marland Kitchen lisinopril (ZESTRIL) 20 MG tablet Take 1 tablet (20 mg total) by mouth daily. 30 tablet 0  . metoprolol succinate (TOPROL-XL) 25 MG 24 hr tablet TAKE 1 TABLET BY MOUTH EVERY DAY 30 tablet 0  . Multiple Vitamin (MULTIVITAMIN WITH MINERALS) TABS tablet Take 1 tablet by mouth daily.    Marland Kitchen omeprazole (PRILOSEC) 40 MG capsule Take 1 capsule (40 mg total) by mouth daily. 90 capsule 3  . PREVIDENT 5000 BOOSTER PLUS 1.1 % PSTE Place 1 application onto teeth 2 (two) times a week.     . Saw Palmetto 450 MG CAPS Take 450 mg by mouth daily.     No current facility-administered medications for this visit.   Allergies:  Patient has no known allergies.   Social History: The patient  reports that he has never smoked. His smokeless tobacco use includes chew. He reports previous alcohol use. He reports that he does not use drugs.   Family History: The patient's family history includes Atrial fibrillation in his mother.   ROS:  Please see the history of present illness. Otherwise, complete review of systems is positive for none.  All other systems are reviewed and negative.   Physical Exam: VS:  BP 90/62   Pulse 61   Ht 6' (1.829 m)   Wt 173 lb 6.4 oz (78.7 kg)   SpO2 95%   BMI 23.52 kg/m , BMI Body mass index is 23.52 kg/m.  Wt Readings from Last 3 Encounters:  12/20/20 173 lb 6.4 oz (78.7 kg)  05/11/20 177 lb (80.3 kg)  04/13/20 181 lb (82.1 kg)    General: Patient appears comfortable at rest. Neck: Supple, no elevated JVP or carotid bruits, no thyromegaly. Lungs: Clear to auscultation, nonlabored breathing at rest. Cardiac: Regular rate and rhythm, no S3 or significant systolic murmur, no pericardial rub. Extremities: No pitting edema, distal pulses 2+. Skin: Warm and dry. Musculoskeletal: No kyphosis. Neuropsychiatric: Alert and oriented x3, affect  grossly appropriate.  ECG:  An ECG dated 12/20/2020 was personally reviewed today and demonstrated:  Sinus bradycardia rate of 59.  Recent Labwork: 04/05/2020: Hemoglobin 14.0; Platelets 213     Component Value Date/Time   CHOL 155 08/22/2019 2345   TRIG 110 08/22/2019 2345   HDL 47 08/22/2019 2345   CHOLHDL 3.3 08/22/2019 2345   VLDL 22 08/22/2019 2345   LDLCALC 86 08/22/2019 2345    Other Studies Reviewed Today:   72-hour cardiac monitor 04/27/2020 Study Highlights  ZIO XT reviewed. 3 days 4 hours analyzed. Predominant rhythm is sinus with heart rate ranging from 57 bpm up to 138 bpm and average heart rate 83 bpm. Rare PACs and PVCs were noted representing less than 1% total beats. There were no sustained arrhythmias or pauses.    Echocardiogram 09/17/2019: 1. Left ventricular ejection fraction, by estimation, is 60 to 65%. The  left ventricle has normal function. The left ventricle has no regional  wall motion abnormalities. There is mild left ventricular  hypertrophy.  Left ventricular diastolic parameters  were normal.  2. Right ventricular systolic function is normal. The right ventricular  size is mildly enlarged. There is normal pulmonary artery systolic  pressure.  3. Hockey stick formation of the anterior mitral valve leaflet in a  pattern commonly seen in rheumatic heart disease. From record review  history of MV repair with edge-to-edge approximation of A2 scallop to P2  scallop which would actually be the cause  of this morpholoical appearance in this case and not rheumatic disease.  Mild mean gradient across the valve of 3 mmHg. . The mitral valve is  abnormal. Trivial mitral valve regurgitation.  4. The aortic valve has an indeterminant number of cusps. Aortic valve  regurgitation is not visualized. No aortic stenosis is present.  5. The inferior vena cava is normal in size with greater than 50%  respiratory variability, suggesting right atrial pressure of 3  mmHg.   Assessment and Plan:  1. S/P aortic dissection repair   2. Essential hypertension   3. Postoperative atrial fibrillation (HCC)   4. Nonrheumatic mitral valve regurgitation    1. S/P aortic dissection repair Previous repair of type a aortic dissection using Hemashield platinum 30 mm vascular graft on 08/23/2019 Dr. Orvan Seen CVTS.  He denies any issues status post aortic dissection repair.  2. Essential hypertension Blood pressure is on the low side today but he is asymptomatic.  His wife brings with her a log of recent blood pressures.  His systolic blood pressures appear to be running in the 110s to 120s.  Continue lisinopril 20 mg daily.  Continue Toprol-XL 25 mg daily.  3. Postoperative atrial fibrillation (HCC) EKG today demonstrates sinus bradycardia with a rate of 59.  He denies any palpitations or arrhythmias.  Continue aspirin 81 mg.  4. Nonrheumatic mitral valve regurgitation Echocardiogram on 09/17/2019 demonstrated hockey-stick formation of the anterior mitral valve leaflet and pattern commonly seen in rheumatic heart disease.  From record review of history of mitral valve repair with edge-to-edge approximation of 8 to scallop the P2 scallop which would actually be the cause of his morphological appearance in his case and not rheumatic disease.  Mild mean gradient across the valve 3 mmHg.  The mitral valve is abnormal.  Trivial mitral valve regurgitation.  Status post mitral valve repair with Alfieri stitch.  Continue aspirin 81 mg.  Medication Adjustments/Labs and Tests Ordered: Current medicines are reviewed at length with the patient today.  Concerns regarding medicines are outlined above.   Disposition: Follow-up with Dr. Domenic Polite or APP 6 months  Signed, Levell July, NP 12/20/2020 8:49 AM    [facility] [place], [place], [place] [address] Phone: [phone]; Fax: [phone]

## 2020-12-20 ENCOUNTER — Ambulatory Visit: Payer: Medicare Other | Admitting: Family Medicine

## 2020-12-20 ENCOUNTER — Encounter: Payer: Self-pay | Admitting: Family Medicine

## 2020-12-20 VITALS — BP 90/62 | HR 61 | Ht 72.0 in | Wt 173.4 lb

## 2020-12-20 DIAGNOSIS — I340 Nonrheumatic mitral (valve) insufficiency: Secondary | ICD-10-CM | POA: Diagnosis not present

## 2020-12-20 DIAGNOSIS — I9789 Other postprocedural complications and disorders of the circulatory system, not elsewhere classified: Secondary | ICD-10-CM

## 2020-12-20 DIAGNOSIS — Z9889 Other specified postprocedural states: Secondary | ICD-10-CM

## 2020-12-20 DIAGNOSIS — I4891 Unspecified atrial fibrillation: Secondary | ICD-10-CM

## 2020-12-20 DIAGNOSIS — I10 Essential (primary) hypertension: Secondary | ICD-10-CM | POA: Diagnosis not present

## 2020-12-20 NOTE — Patient Instructions (Signed)
Medication Instructions:  Continue all current medications.   Labwork: none  Testing/Procedures: none  Follow-Up: 6 months   Any Other Special Instructions Will Be Listed Below (If Applicable).   If you need a refill on your cardiac medications before your next appointment, please call your pharmacy.  

## 2021-01-08 IMAGING — CT CT ANGIO CHEST
2 of 5 series · 17 of 46 positions shown · IV contrast (OMNI)
Comparison: 08/23/2019.

CLINICAL DATA: Status post replacement of the ascending aorta.
Assessed descending component.

EXAM:
CT ANGIOGRAPHY CHEST WITH CONTRAST
TECHNIQUE: Multidetector CT imaging of the chest was performed using the
standard protocol during bolus administration of intravenous
contrast. Multiplanar CT image reconstructions and MIPs were
obtained to evaluate the vascular anatomy.
CONTRAST:  80mL OMNIPAQUE IOHEXOL 350 MG/ML SOLN

[Series 5: dissection 3.0 i30f 3 · axial · 0.73mm/px · z∈[+1132,+1374]mm · 14 of 93 slices shown]
[im 6/93  lung]
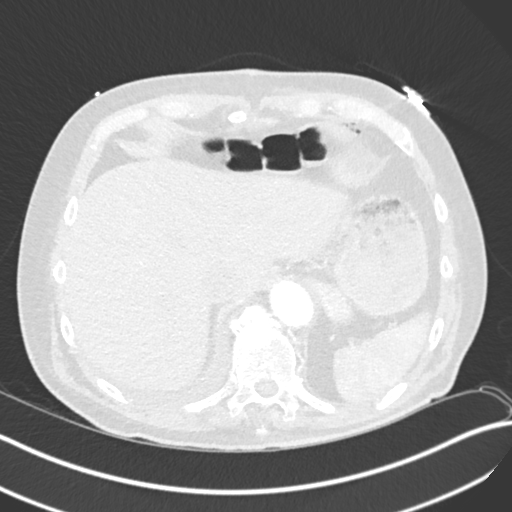
[im 12/93  soft-tissue]
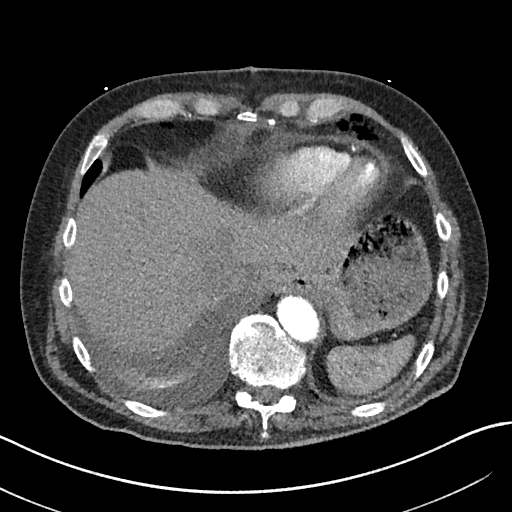
[im 18/93  lung]
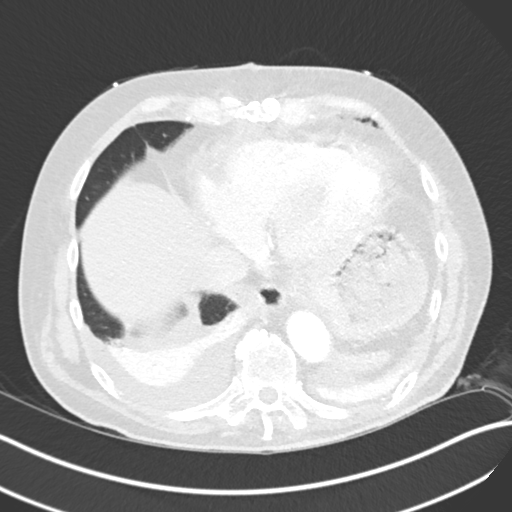
[im 24/93  soft-tissue]
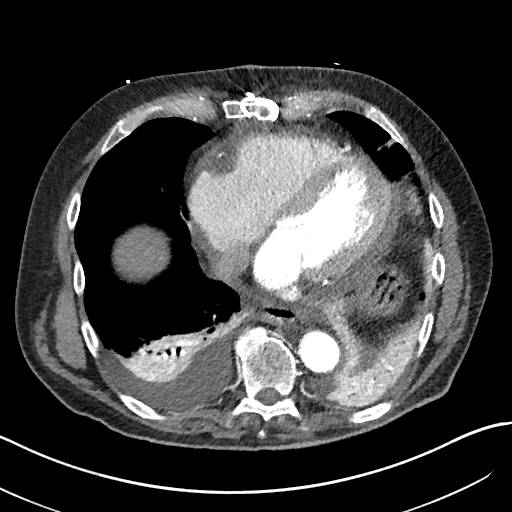
[im 30/93  lung]
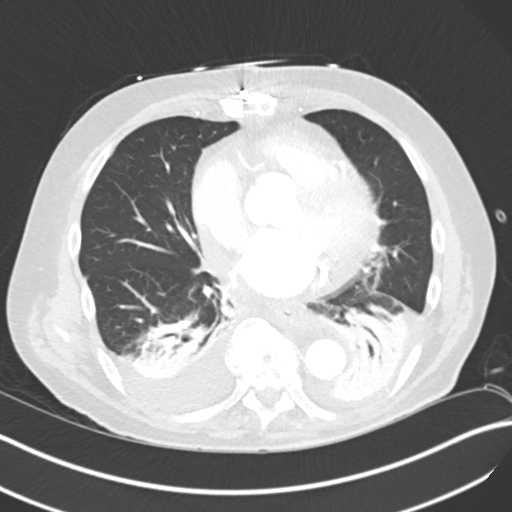
[im 36/93  soft-tissue]
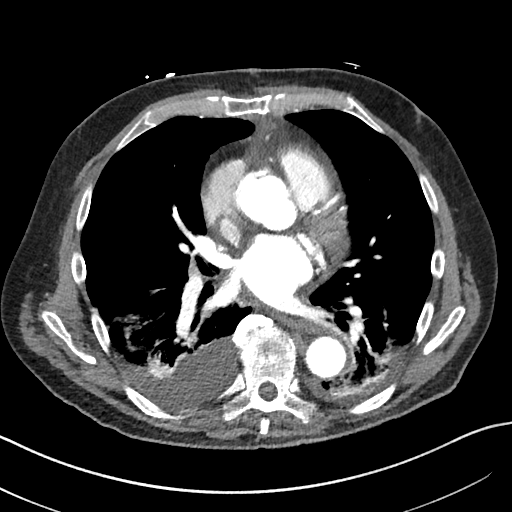
[im 42/93  lung]
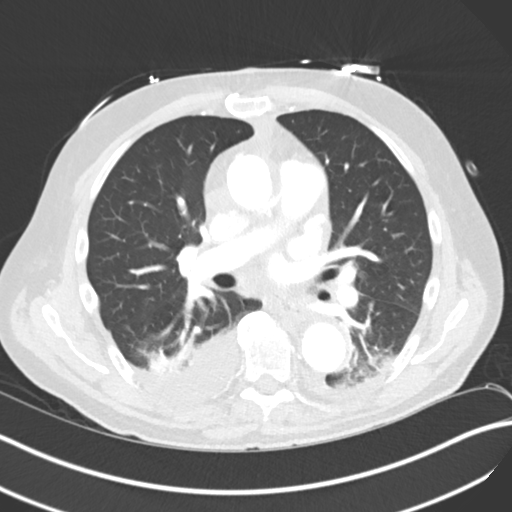
[im 51/93  soft-tissue]
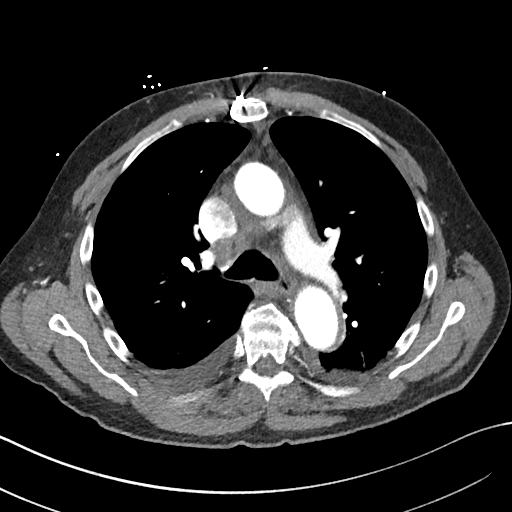
[im 57/93  lung]
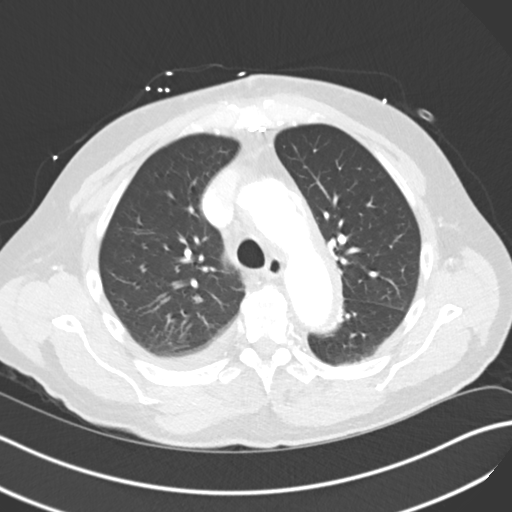
[im 63/93  soft-tissue]
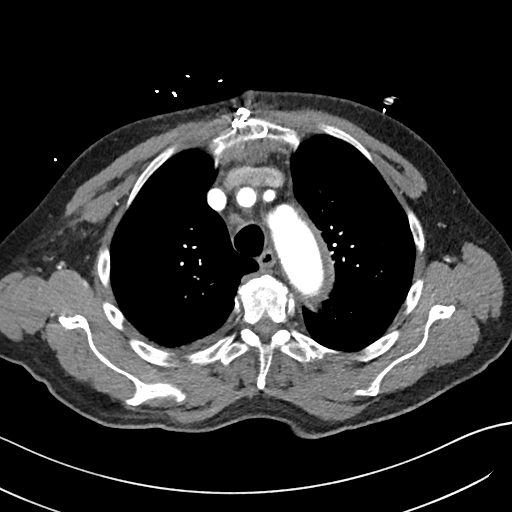
[im 69/93  lung]
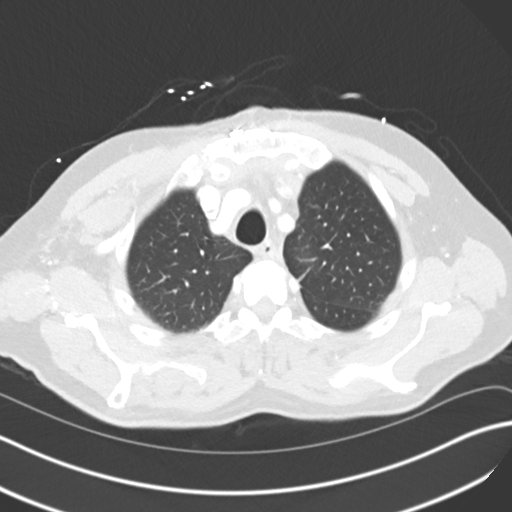
[im 75/93  soft-tissue]
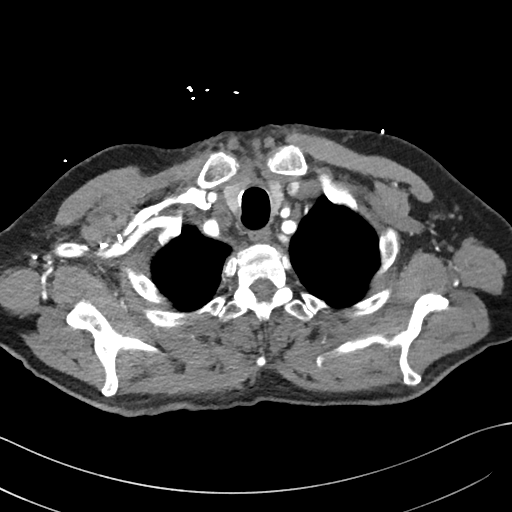
[im 81/93  lung]
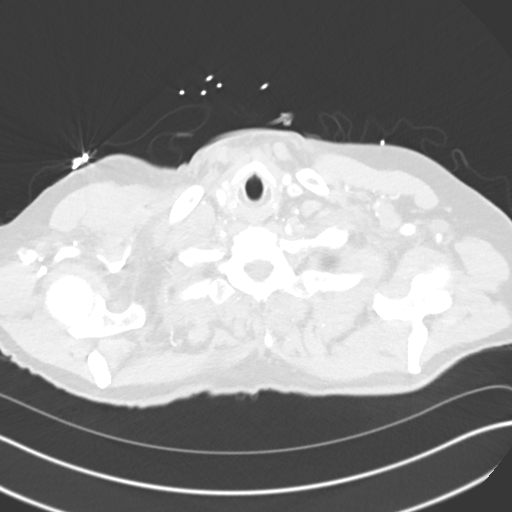
[im 87/93  soft-tissue]
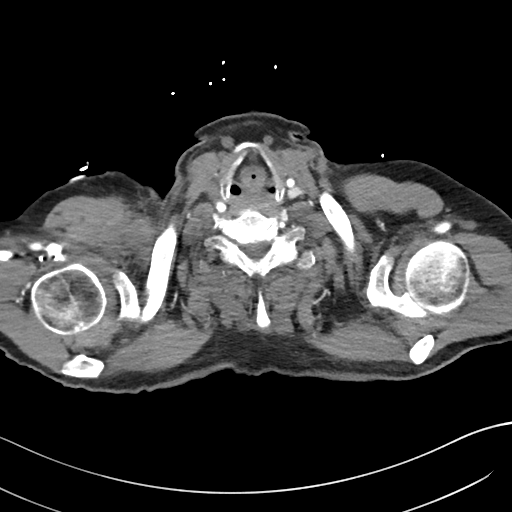

[Series 8: coronals · coronal · 0.57mm/px · 3 of 150 slices shown]
[im 38/150  soft-tissue]
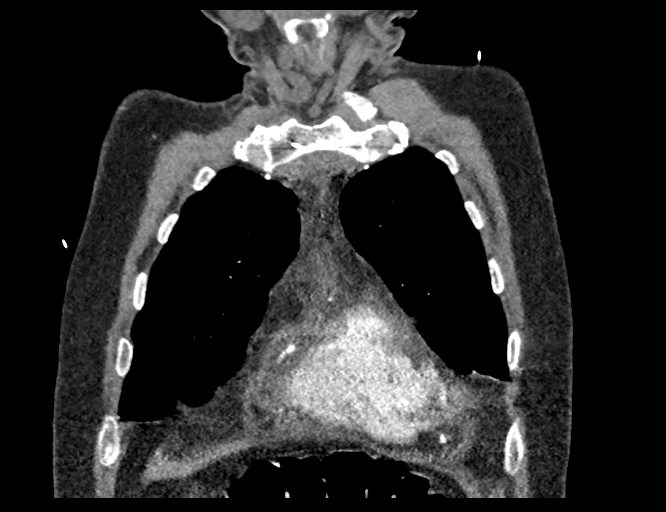
[im 75/150  soft-tissue]
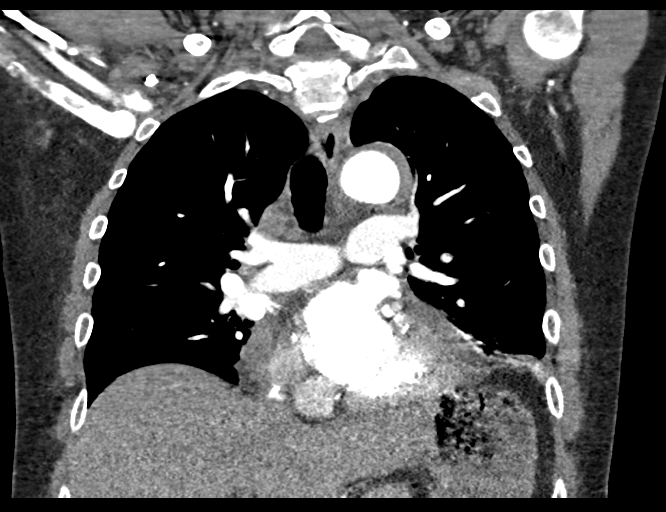
[im 112/150  soft-tissue]
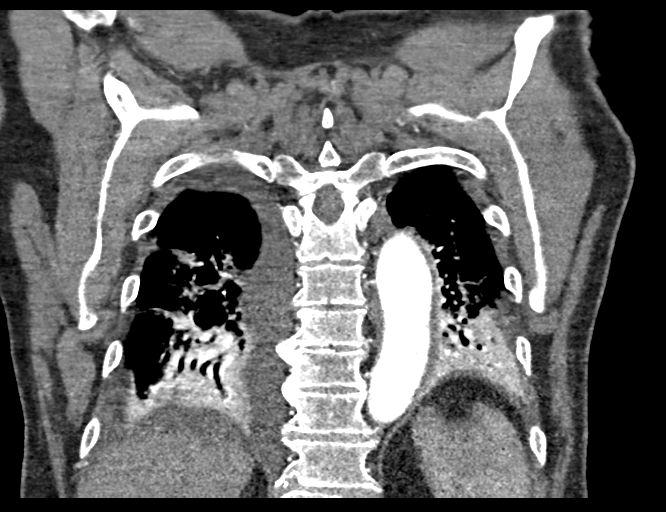

[17 of 46 positions shown; findings below may reference images not displayed]

FINDINGS: Cardiovascular: Since the prior CT scan, the aortic dissection has
been repaired. There is no residual dissection of the ascending
aorta. Ascending aorta is normal in caliber, 3 cm in diameter. There
is low attenuation along the margin of the thoracic aorta from the
distal arch through the descending portion consistent with edema.
Descending thoracic aorta is normal in caliber, without
atherosclerotic change. Aortic arch branch vessels are widely
patent, without dissection.

Heart is top-normal in size. There is pericardial fluid or edema.
Three-vessel coronary artery calcifications are noted.

Central pulmonary arteries are unremarkable.

Mediastinum/Nodes: There is postsurgical edema along the anterior
mediastinum without a defined hematoma or mass. No mediastinal or
hilar adenopathy. Trachea is widely patent. Esophagus is
unremarkable.

Lungs/Pleura: Small right and minimal left pleural effusions. There
is dependent opacity in both lower lobes consistent with
atelectasis. Minor linear atelectasis is noted at the bases of the
right middle lobe and left upper lobe lingula. Remainder of the
lungs is clear. No pulmonary edema. No pneumothorax.

Upper Abdomen: Limited visualization of the upper abdomen shows no
acute or significant abnormalities.

Musculoskeletal: No fracture or acute finding.  No bone lesion.

Review of the MIP images confirms the above findings.
IMPRESSION: 1. Status postoperative repair of an aortic dissection. There is no
residual dissection or evidence of a complication of the repair. The
ascending aorta, arch and descending aorta are normal in caliber.
2. Small amount of pericardial edema/fluid. Postoperative edema
noted along the anterior mediastinum. No mediastinal hematoma.
3. Small right minimal left pleural effusions associated with the
pendant lower lobe atelectasis.

## 2021-01-08 IMAGING — CR DG CHEST 2V
2 series · 2 of 2 positions shown · non-contrast
Comparison: 08/26/2019

CLINICAL DATA: Status post coronary bypass grafting

EXAM:
CHEST - 2 VIEW

[chest pa]
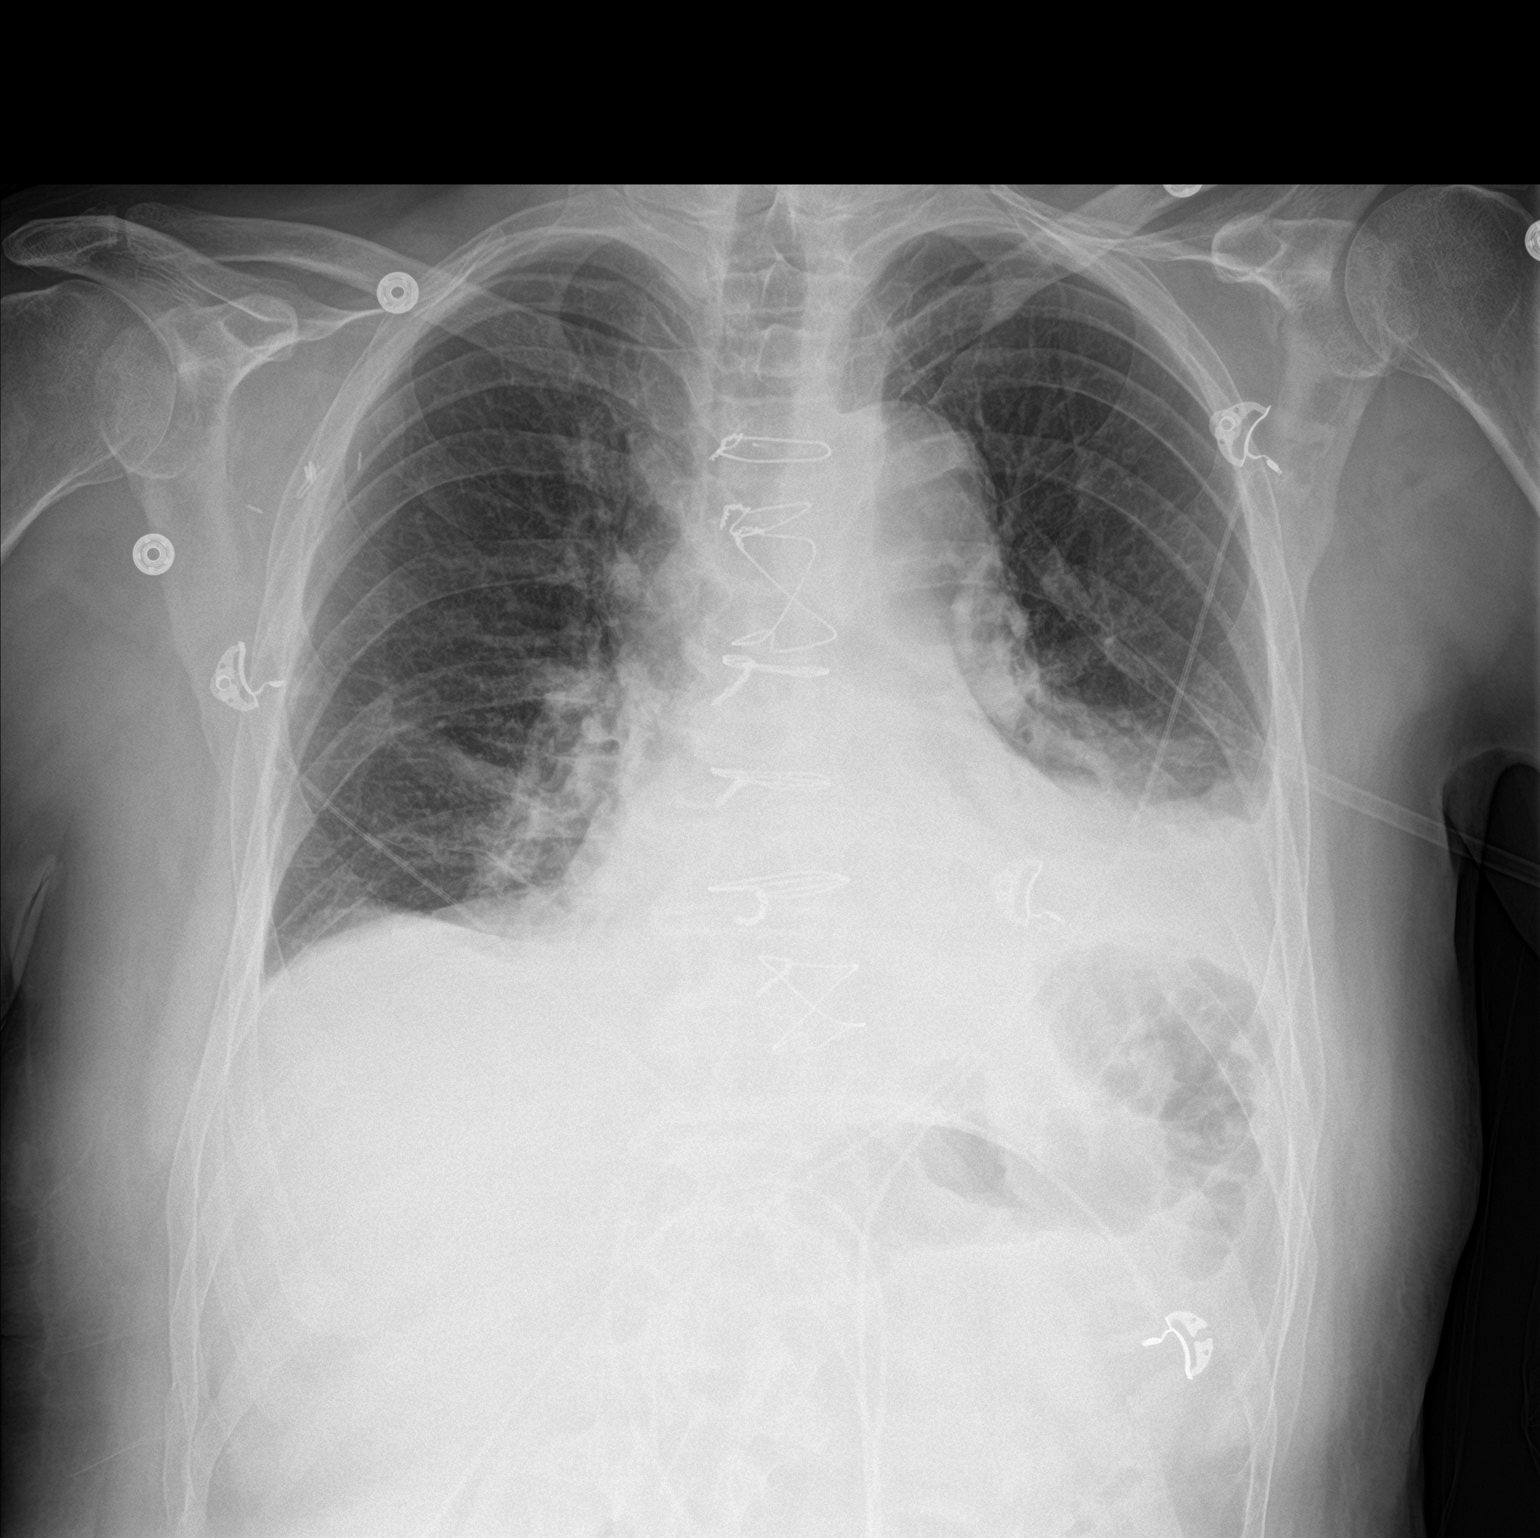

[chest lat]
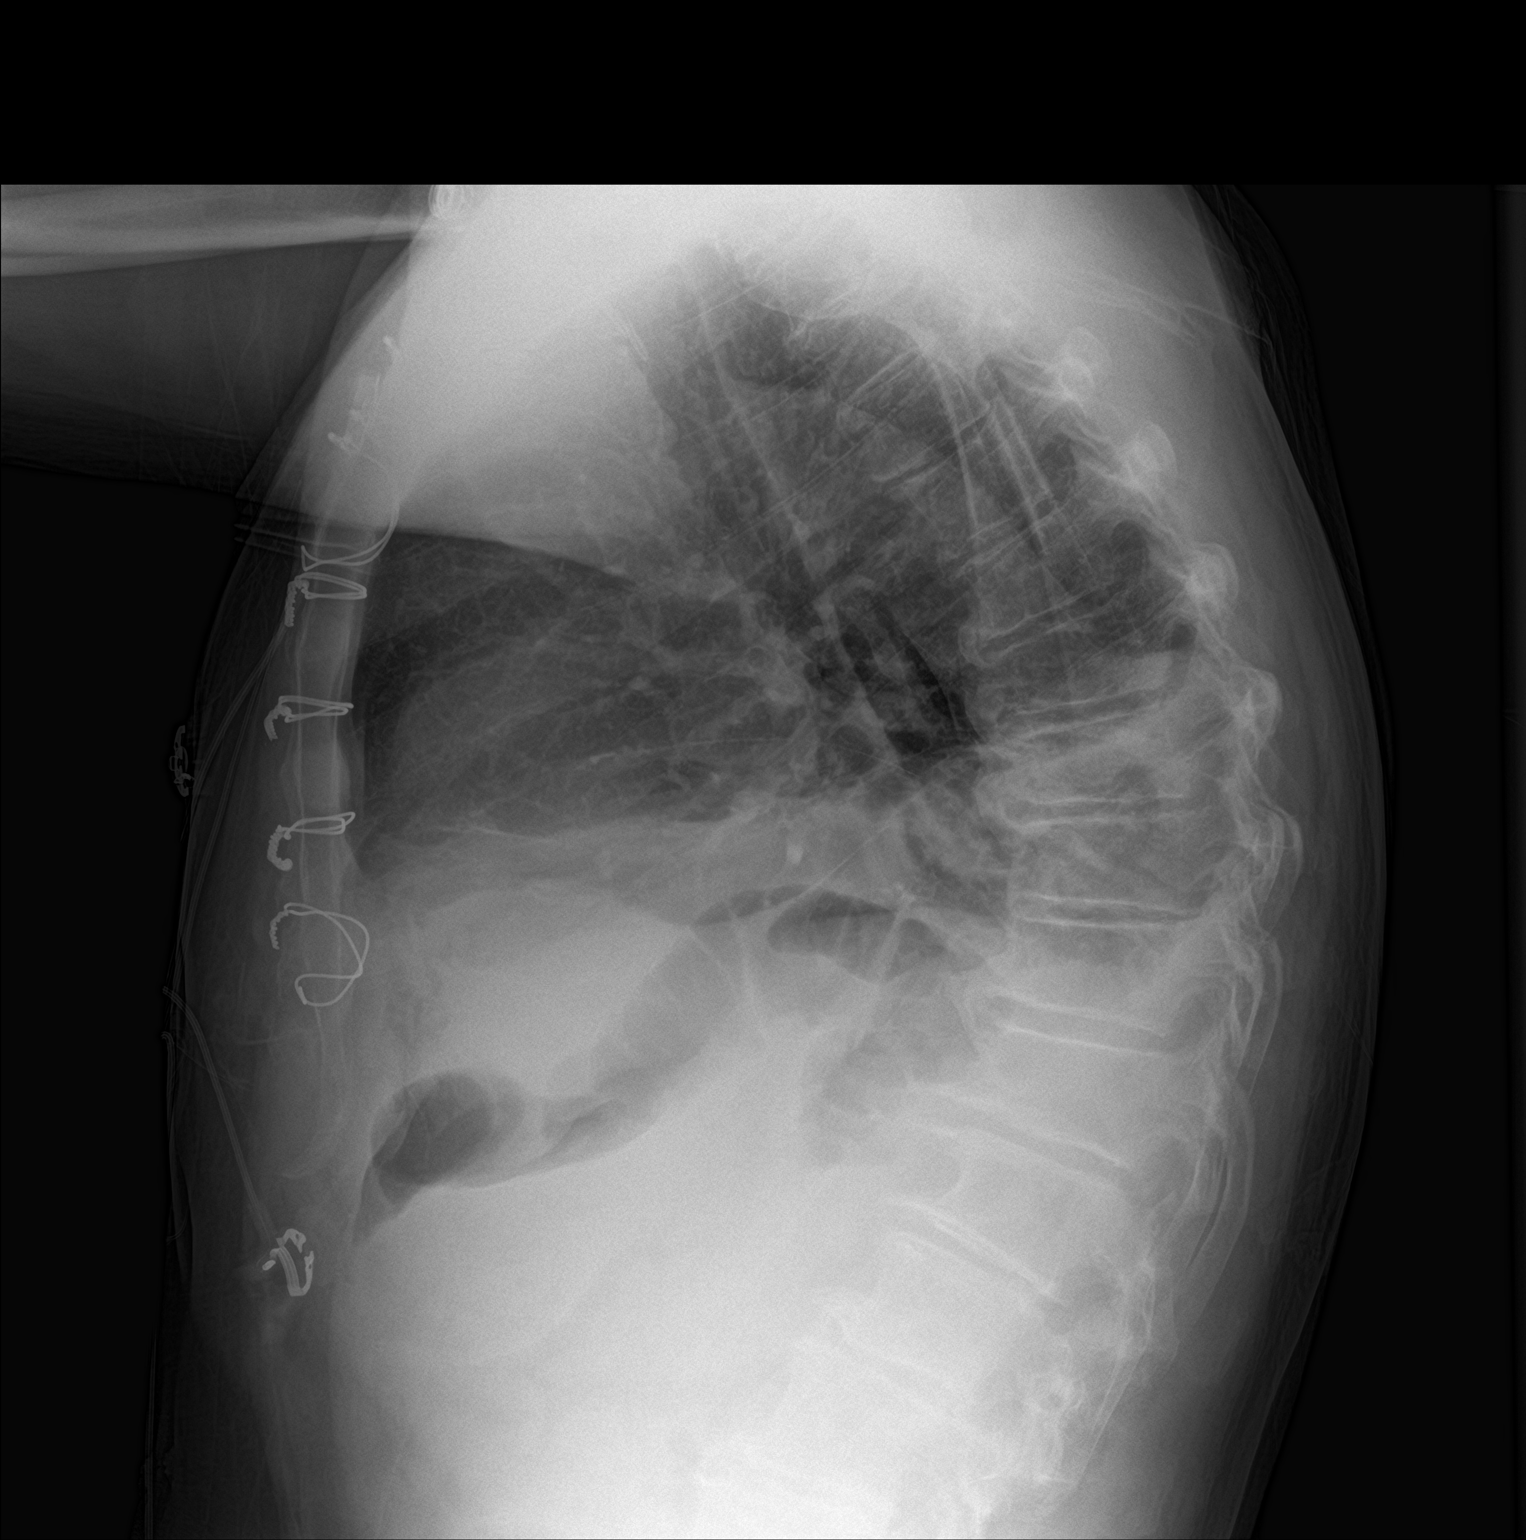

[2 of 2 positions shown; findings below may reference images not displayed]

FINDINGS: Cardiac shadow is stable. Left jugular central line and sheath have
been removed in the interval. Postsurgical changes are seen and
stable. Persistent left basilar atelectasis with effusion is seen.
No pneumothorax is noted. Minimal right basilar atelectasis is seen.
IMPRESSION: Overall stable appearance of the chest.

Interval removal of left jugular central line.

## 2021-01-09 ENCOUNTER — Other Ambulatory Visit: Payer: Self-pay | Admitting: Student

## 2021-01-12 IMAGING — DX DG CHEST 1V
1 series · 1 of 1 positions shown · non-contrast
Comparison: 08/30/2019

CLINICAL DATA: Rapid heart rate.

EXAM:
CHEST  1 VIEW

[chest ap]
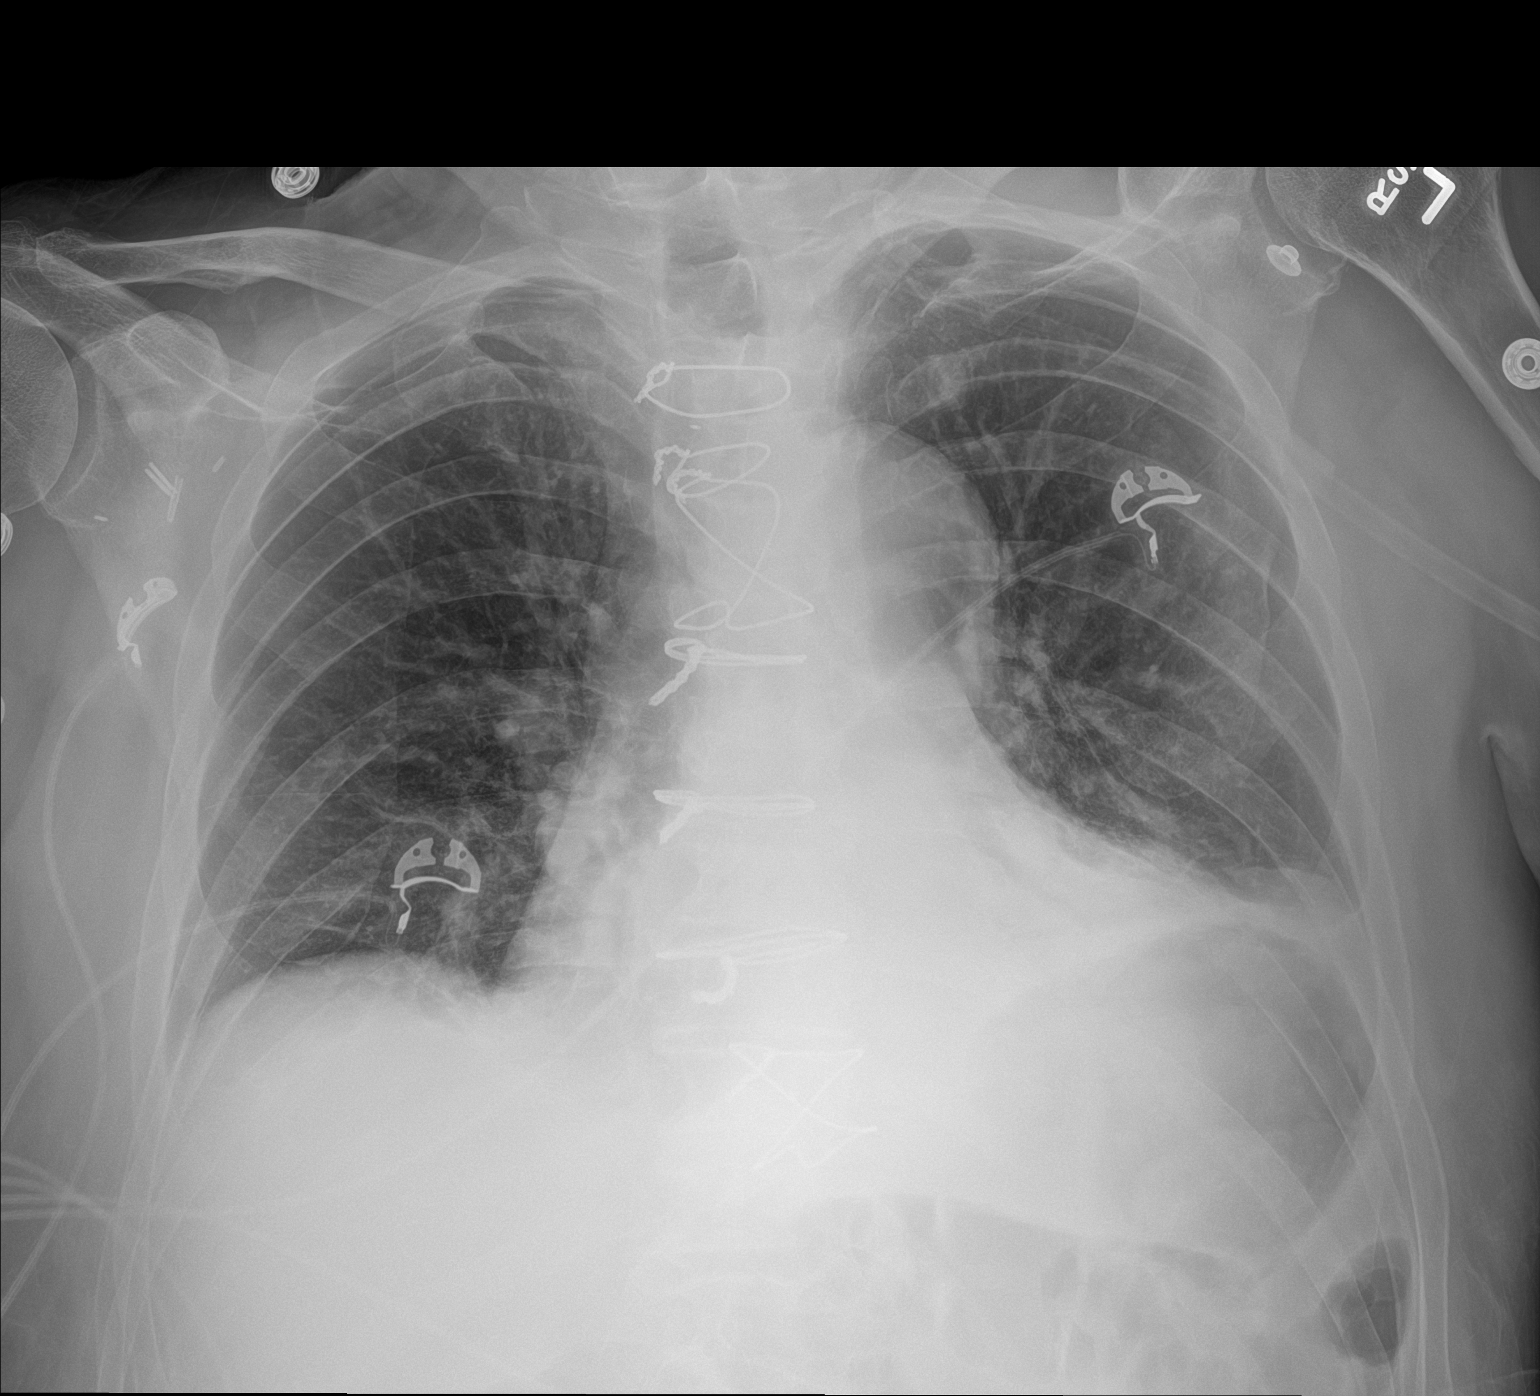

[1 of 1 positions shown; findings below may reference images not displayed]

FINDINGS: Sternal wires overlie stable cardiac silhouette. LEFT basilar
atelectasis and small effusions unchanged. No pulmonary edema. No
consolidation. No pneumothorax.
IMPRESSION: No interval change. Persistent LEFT lower lobe atelectasis and
effusion.

## 2021-01-28 DIAGNOSIS — R059 Cough, unspecified: Secondary | ICD-10-CM | POA: Diagnosis not present

## 2021-01-28 DIAGNOSIS — Z20828 Contact with and (suspected) exposure to other viral communicable diseases: Secondary | ICD-10-CM | POA: Diagnosis not present

## 2021-03-13 DIAGNOSIS — Z6825 Body mass index (BMI) 25.0-25.9, adult: Secondary | ICD-10-CM | POA: Diagnosis not present

## 2021-03-13 DIAGNOSIS — K219 Gastro-esophageal reflux disease without esophagitis: Secondary | ICD-10-CM | POA: Diagnosis not present

## 2021-03-13 DIAGNOSIS — I10 Essential (primary) hypertension: Secondary | ICD-10-CM | POA: Diagnosis not present

## 2021-03-13 DIAGNOSIS — Z8679 Personal history of other diseases of the circulatory system: Secondary | ICD-10-CM | POA: Diagnosis not present

## 2021-04-09 ENCOUNTER — Other Ambulatory Visit (INDEPENDENT_AMBULATORY_CARE_PROVIDER_SITE_OTHER): Payer: Self-pay | Admitting: Gastroenterology

## 2021-06-22 ENCOUNTER — Other Ambulatory Visit: Payer: Self-pay

## 2021-06-22 ENCOUNTER — Encounter: Payer: Self-pay | Admitting: Cardiology

## 2021-06-22 ENCOUNTER — Ambulatory Visit (INDEPENDENT_AMBULATORY_CARE_PROVIDER_SITE_OTHER): Payer: Medicare Other | Admitting: Cardiology

## 2021-06-22 VITALS — BP 98/68 | HR 81 | Ht 72.0 in | Wt 179.0 lb

## 2021-06-22 DIAGNOSIS — I10 Essential (primary) hypertension: Secondary | ICD-10-CM

## 2021-06-22 DIAGNOSIS — Z9889 Other specified postprocedural states: Secondary | ICD-10-CM

## 2021-06-22 NOTE — Progress Notes (Signed)
Cardiology Office Note  Date: 06/22/2021   ID: Beulah Matusek, DOB 06-25-1951, MRN [id number]  PCP:  Lanelle Bal, PA-C  Cardiologist:  Rozann Lesches, MD Electrophysiologist:  None   Chief Complaint  Patient presents with   Cardiac follow-up     History of Present Illness: Henry Hayden is a 70 y.o. male seen in June by Mr. Leonides Sake NP.  He is here with his wife for a follow-up visit.  Reports no change in stamina, still functional with outdoor chores, NYHA class II dyspnea, no palpitations or chest pain.  We went over his home blood pressure checks which look quite good, he takes a measurement of the morning before his medications.  Most systolics are in the 270J.  Blood pressure was lower today, but he is asymptomatic.  I reviewed his echocardiogram from August 2021 as noted below.  He had only trivial mitral regurgitation at that time status post mitral valve repair.  LVEF 60 to 65%.  He continues to follow with PCP for routine lab work.  Past Medical History:  Diagnosis Date   Aortic dissection (HCC)    Type A   Essential hypertension    Mitral regurgitation    Postoperative atrial fibrillation (HCC)     Past Surgical History:  Procedure Laterality Date   AORTIC VALVE REPAIR N/A 08/23/2019   Procedure: REPAIR OF TYPE A AORTIC DISSECTION USING HEMASHIELF PLATINUM 30MM VASCULAR GRAFT;  Surgeon: Wonda Olds, MD;  Location: [facility];  Service: Open Heart Surgery;  Laterality: N/A;   BIOPSY  05/13/2020   Procedure: BIOPSY;  Surgeon: Harvel Quale, MD;  [facility];  Service: Gastroenterology;;   COLONOSCOPY WITH PROPOFOL N/A 05/13/2020   Procedure: COLONOSCOPY WITH PROPOFOL;  Surgeon: Harvel Quale, MD;  [facility];  Service: Gastroenterology;  Laterality: N/A;  915   ESOPHAGOGASTRODUODENOSCOPY (EGD) WITH PROPOFOL N/A 05/13/2020   Procedure: ESOPHAGOGASTRODUODENOSCOPY (EGD) WITH PROPOFOL;  Surgeon: Harvel Quale, MD;  [facility];  Service: Gastroenterology;  Laterality: N/A;   IR THORACENTESIS ASP PLEURAL SPACE W/IMG GUIDE  08/27/2019   MITRAL VALVE REPAIR  08/23/2019   Procedure: Mitral Valve Repair (Mvr);  Surgeon: Wonda Olds, MD;  [facility];  Service: Open Heart Surgery;;   POLYPECTOMY  05/13/2020   Procedure: POLYPECTOMY;  Surgeon: Montez Morita, Quillian Quince, MD;  [facility];  Service: Gastroenterology;;    Current Outpatient Medications  Medication Sig Dispense Refill   amoxicillin (AMOXIL) 500 MG capsule Take 4 capsules by mouth. Prior to dental work.     aspirin EC 81 MG tablet Take 81 mg by mouth daily.     calcium elemental as carbonate (TUMS ULTRA 1000) 400 MG chewable tablet Chew 1,000 mg by mouth daily.     Cholecalciferol (VITAMIN D) 50 MCG (2000 UT) tablet Take 2,000 Units by mouth daily.     lisinopril (ZESTRIL) 20 MG tablet Take 1 tablet (20 mg total) by mouth daily. 30 tablet 0   metoprolol succinate (TOPROL-XL) 25 MG 24 hr tablet TAKE 1 TABLET BY MOUTH EVERY DAY - patient needs APPOINTMENT FOR further refills PER Dr. 90 tablet 3   Multiple Vitamin (MULTIVITAMIN WITH MINERALS) TABS tablet Take 1 tablet by mouth daily.     omeprazole (PRILOSEC) 40 MG capsule Take 1 capsule (40 mg total) by mouth daily. 90 capsule 3   Saw Palmetto 450 MG CAPS Take 450 mg by mouth daily.     No current  facility-administered medications for this visit.   Allergies:  Patient has no known allergies.   ROS: No orthopnea or PND.  Physical Exam: VS:  BP 98/68   Pulse 81   Ht 6' (1.829 m)   Wt 179 lb (81.2 kg)   SpO2 97%   BMI 24.28 kg/m , BMI Body mass index is 24.28 kg/m.  Wt Readings from Last 3 Encounters:  06/22/21 179 lb (81.2 kg)  12/20/20 173 lb 6.4 oz (78.7 kg)  05/11/20 177 lb (80.3 kg)    General: Patient appears comfortable at rest. HEENT: Conjunctiva and lids normal, wearing a mask. Neck: Supple, no elevated JVP or carotid bruits, no  thyromegaly. Lungs: Clear to auscultation, nonlabored breathing at rest. Cardiac: Regular rate and rhythm, no S3 or significant systolic murmur, no pericardial rub. Extremities: No pitting edema.  ECG:  An ECG dated 12/20/2020 was personally reviewed today and demonstrated:  Sinus bradycardia.  Recent Labwork:    Component Value Date/Time   CHOL 155 08/22/2019 2345   TRIG 110 08/22/2019 2345   HDL 47 08/22/2019 2345   CHOLHDL 3.3 08/22/2019 2345   VLDL 22 08/22/2019 2345   LDLCALC 86 08/22/2019 2345    Other Studies Reviewed Today:  Echocardiogram 03/14/2020:  1. Left ventricular ejection fraction, by estimation, is 60 to 65%. The  left ventricle has normal function. The left ventricle has no regional  wall motion abnormalities. Left ventricular diastolic parameters are  consistent with Grade I diastolic  dysfunction (impaired relaxation).   2. Right ventricular systolic function is normal. The right ventricular  size is normal. There is normal pulmonary artery systolic pressure.   3. Alfieri edge-to-edge approximation of A2 scallop to P2 scallop. The  mitral valve has been repaired/replaced. Trivial mitral valve  regurgitation. No evidence of mitral stenosis.   4. The aortic valve has an indeterminant number of cusps. Aortic valve  regurgitation is not visualized. No aortic stenosis is present.   5. Aortic dilatation noted. There is mild dilatation of the aortic root  measuring 40 mm.   Assessment and Plan:  1.  History of type A aortic dissection status post emergent repair with 30 mm Hemashield graft.  He remains clinically stable.  2.  Essential hypertension, blood pressure low normal range, asymptomatic.  For now continue current doses of lisinopril and Toprol-XL.  Could consider reducing lisinopril if he develops any symptoms of hypotension.  3.  Status post mitral valve repair with Alfieri stitch at the time of type A aortic dissection repair.  Echocardiogram from August  2021 showed trivial mitral regurgitation.  No significant change in cardiac examination.  Medication Adjustments/Labs and Tests Ordered: Current medicines are reviewed at length with the patient today.  Concerns regarding medicines are outlined above.   Tests Ordered: No orders of the defined types were placed in this encounter.   Medication Changes: No orders of the defined types were placed in this encounter.   Disposition:  Follow up  6 months.  Signed, Satira Sark, MD, [facility] 06/22/2021 9:24 AM    [facility] [place], [place], [place] [address] Phone: [phone]; Fax: [phone]

## 2021-06-22 NOTE — Patient Instructions (Addendum)

## 2021-07-24 DIAGNOSIS — H3561 Retinal hemorrhage, right eye: Secondary | ICD-10-CM | POA: Diagnosis not present

## 2021-07-31 DIAGNOSIS — L570 Actinic keratosis: Secondary | ICD-10-CM | POA: Diagnosis not present

## 2021-07-31 DIAGNOSIS — D485 Neoplasm of uncertain behavior of skin: Secondary | ICD-10-CM | POA: Diagnosis not present

## 2021-07-31 DIAGNOSIS — C44329 Squamous cell carcinoma of skin of other parts of face: Secondary | ICD-10-CM | POA: Diagnosis not present

## 2021-08-10 DIAGNOSIS — C44329 Squamous cell carcinoma of skin of other parts of face: Secondary | ICD-10-CM | POA: Diagnosis not present

## 2021-08-11 DIAGNOSIS — G453 Amaurosis fugax: Secondary | ICD-10-CM | POA: Diagnosis not present

## 2021-08-11 DIAGNOSIS — H2513 Age-related nuclear cataract, bilateral: Secondary | ICD-10-CM | POA: Diagnosis not present

## 2021-08-11 DIAGNOSIS — H34231 Retinal artery branch occlusion, right eye: Secondary | ICD-10-CM | POA: Diagnosis not present

## 2021-08-11 DIAGNOSIS — H43813 Vitreous degeneration, bilateral: Secondary | ICD-10-CM | POA: Diagnosis not present

## 2021-08-17 ENCOUNTER — Encounter: Payer: Self-pay | Admitting: *Deleted

## 2021-08-17 ENCOUNTER — Other Ambulatory Visit: Payer: Self-pay | Admitting: *Deleted

## 2021-08-17 ENCOUNTER — Telehealth: Payer: Self-pay | Admitting: Cardiology

## 2021-08-17 DIAGNOSIS — G453 Amaurosis fugax: Secondary | ICD-10-CM

## 2021-08-17 DIAGNOSIS — H34231 Retinal artery branch occlusion, right eye: Secondary | ICD-10-CM

## 2021-08-17 NOTE — Telephone Encounter (Signed)
Checking percert on the following patient for testing scheduled at [facility]    CARTOID STUDY 08/23/2021

## 2021-08-17 NOTE — Progress Notes (Signed)
Request came via fax for a referral from Dr. Sherlynn Stalls for patient to have a carotid ultrasound asap. Also included office note from [facility] (Dr. Leticia Clas, OD). Notes and request reviewed by Dr. Domenic Polite and [facility] ordering carotid doppler.  Order placed and sent to schedulers.

## 2021-08-23 ENCOUNTER — Ambulatory Visit (HOSPITAL_COMMUNITY)
Admission: RE | Admit: 2021-08-23 | Discharge: 2021-08-23 | Disposition: A | Discharge status: Home / Self Care | Payer: Medicare Other | Source: Ambulatory Visit | Attending: Cardiology | Admitting: Cardiology

## 2021-08-23 ENCOUNTER — Other Ambulatory Visit: Payer: Self-pay

## 2021-08-23 DIAGNOSIS — I6523 Occlusion and stenosis of bilateral carotid arteries: Secondary | ICD-10-CM | POA: Diagnosis not present

## 2021-08-23 DIAGNOSIS — H539 Unspecified visual disturbance: Secondary | ICD-10-CM | POA: Diagnosis not present

## 2021-08-23 DIAGNOSIS — H34231 Retinal artery branch occlusion, right eye: Secondary | ICD-10-CM | POA: Insufficient documentation

## 2021-08-23 DIAGNOSIS — I771 Stricture of artery: Secondary | ICD-10-CM | POA: Diagnosis not present

## 2021-08-23 DIAGNOSIS — I10 Essential (primary) hypertension: Secondary | ICD-10-CM | POA: Diagnosis not present

## 2021-08-23 DIAGNOSIS — G453 Amaurosis fugax: Secondary | ICD-10-CM | POA: Insufficient documentation

## 2022-01-09 ENCOUNTER — Ambulatory Visit: Payer: Medicare Other | Admitting: Cardiology

## 2022-01-09 ENCOUNTER — Encounter: Payer: Self-pay | Admitting: Cardiology

## 2022-01-09 VITALS — BP 100/62 | HR 77 | Ht 72.0 in | Wt 179.8 lb

## 2022-01-09 DIAGNOSIS — Z8679 Personal history of other diseases of the circulatory system: Secondary | ICD-10-CM | POA: Diagnosis not present

## 2022-01-09 DIAGNOSIS — I10 Essential (primary) hypertension: Secondary | ICD-10-CM | POA: Diagnosis not present

## 2022-01-09 DIAGNOSIS — Z9889 Other specified postprocedural states: Secondary | ICD-10-CM

## 2022-01-13 ENCOUNTER — Other Ambulatory Visit: Payer: Self-pay | Admitting: Student

## 2022-01-31 DIAGNOSIS — L570 Actinic keratosis: Secondary | ICD-10-CM | POA: Diagnosis not present

## 2022-03-15 DIAGNOSIS — Z8679 Personal history of other diseases of the circulatory system: Secondary | ICD-10-CM | POA: Diagnosis not present

## 2022-03-15 DIAGNOSIS — K219 Gastro-esophageal reflux disease without esophagitis: Secondary | ICD-10-CM | POA: Diagnosis not present

## 2022-03-15 DIAGNOSIS — Z6826 Body mass index (BMI) 26.0-26.9, adult: Secondary | ICD-10-CM | POA: Diagnosis not present

## 2022-03-15 DIAGNOSIS — I10 Essential (primary) hypertension: Secondary | ICD-10-CM | POA: Diagnosis not present

## 2022-05-16 DIAGNOSIS — J0101 Acute recurrent maxillary sinusitis: Secondary | ICD-10-CM | POA: Diagnosis not present

## 2022-05-16 DIAGNOSIS — J209 Acute bronchitis, unspecified: Secondary | ICD-10-CM | POA: Diagnosis not present

## 2022-05-16 DIAGNOSIS — Z6826 Body mass index (BMI) 26.0-26.9, adult: Secondary | ICD-10-CM | POA: Diagnosis not present

## 2022-05-16 DIAGNOSIS — R030 Elevated blood-pressure reading, without diagnosis of hypertension: Secondary | ICD-10-CM | POA: Diagnosis not present

## 2022-06-05 DIAGNOSIS — H40013 Open angle with borderline findings, low risk, bilateral: Secondary | ICD-10-CM | POA: Diagnosis not present

## 2022-07-06 ENCOUNTER — Encounter: Payer: Self-pay | Admitting: Cardiology

## 2022-07-06 ENCOUNTER — Ambulatory Visit: Payer: Medicare Other | Attending: Cardiology | Admitting: Cardiology

## 2022-07-06 VITALS — BP 110/80 | HR 80 | Ht 72.0 in | Wt 181.4 lb

## 2022-07-06 DIAGNOSIS — Z9889 Other specified postprocedural states: Secondary | ICD-10-CM

## 2022-07-06 NOTE — Progress Notes (Signed)
Cardiology Office Note  Date: 07/06/2022   ID: Henry Hayden, DOB 01-30-1951, MRN [id number]  PCP:  Lanelle Bal, PA-C  Cardiologist:  Rozann Lesches, MD Electrophysiologist:  None   Chief Complaint  Patient presents with   Cardiac follow-up    History of Present Illness: Henry Hayden is a 71 y.o. male last seen in June.  He is here with his wife for a follow-up visit.  Continues to do very well, no exertional chest pain, NYHA class II dyspnea, no palpitations or syncope.  Reports no change in stamina and continues with regular chores.  Blood pressure well-controlled today on current regimen including Zestril and Toprol-XL.  His last echocardiogram was in August 2021.  We discussed getting an updated study for his next visit in 6 months.  Past Medical History:  Diagnosis Date   Aortic dissection (HCC)    Type A   Essential hypertension    Mitral regurgitation    Postoperative atrial fibrillation (HCC)     Past Surgical History:  Procedure Laterality Date   AORTIC VALVE REPAIR N/A 08/23/2019   Procedure: REPAIR OF TYPE A AORTIC DISSECTION USING HEMASHIELF PLATINUM 30MM VASCULAR GRAFT;  Surgeon: Wonda Olds, MD;  Location: [facility];  Service: Open Heart Surgery;  Laterality: N/A;   BIOPSY  05/13/2020   Procedure: BIOPSY;  Surgeon: Harvel Quale, MD;  [facility];  Service: Gastroenterology;;   COLONOSCOPY WITH PROPOFOL N/A 05/13/2020   Procedure: COLONOSCOPY WITH PROPOFOL;  Surgeon: Harvel Quale, MD;  [facility];  Service: Gastroenterology;  Laterality: N/A;  915   ESOPHAGOGASTRODUODENOSCOPY (EGD) WITH PROPOFOL N/A 05/13/2020   Procedure: ESOPHAGOGASTRODUODENOSCOPY (EGD) WITH PROPOFOL;  Surgeon: Harvel Quale, MD;  [facility];  Service: Gastroenterology;  Laterality: N/A;   IR THORACENTESIS ASP PLEURAL SPACE W/IMG GUIDE  08/27/2019   MITRAL VALVE REPAIR  08/23/2019   Procedure: Mitral Valve  Repair (Mvr);  Surgeon: Wonda Olds, MD;  Location: [facility];  Service: Open Heart Surgery;;   POLYPECTOMY  05/13/2020   Procedure: POLYPECTOMY;  Surgeon: Montez Morita, Quillian Quince, MD;  [facility];  Service: Gastroenterology;;    Current Outpatient Medications  Medication Sig Dispense Refill   amoxicillin (AMOXIL) 500 MG capsule Take 4 capsules by mouth. Prior to dental work.     aspirin EC 81 MG tablet Take 81 mg by mouth daily.     Cholecalciferol (VITAMIN D) 50 MCG (2000 UT) tablet Take 2,000 Units by mouth daily.     lisinopril (ZESTRIL) 20 MG tablet Take 1 tablet (20 mg total) by mouth daily. 30 tablet 0   metoprolol succinate (TOPROL-XL) 25 MG 24 hr tablet TAKE 1 TABLET BY MOUTH EVERY DAY 90 tablet 3   Multiple Vitamin (MULTIVITAMIN WITH MINERALS) TABS tablet Take 1 tablet by mouth daily.     omeprazole (PRILOSEC) 40 MG capsule Take 1 capsule (40 mg total) by mouth daily. 90 capsule 3   Saw Palmetto 450 MG CAPS Take 450 mg by mouth daily.     No current facility-administered medications for this visit.   Allergies:  Patient has no known allergies.   ROS: No syncope.  Physical Exam: VS:  BP 110/80   Pulse 80   Ht 6' (1.829 m)   Wt 181 lb 6.4 oz (82.3 kg)   SpO2 96%   BMI 24.60 kg/m , BMI Body mass index is 24.6 kg/m.  Wt Readings from Last 3 Encounters:  07/06/22 181  lb 6.4 oz (82.3 kg)  01/09/22 179 lb 12.8 oz (81.6 kg)  06/22/21 179 lb (81.2 kg)    General: Patient appears comfortable at rest. HEENT: Conjunctiva and lids normal. Neck: Supple, no elevated JVP or carotid bruits. Lungs: Clear to auscultation, nonlabored breathing at rest. Cardiac: Regular rate and rhythm, no S3 or significant systolic murmur. Extremities: No pitting edema.  ECG:  An ECG dated 01/09/2022 was personally reviewed today and demonstrated:  Sinus rhythm.  Recent Labwork:    Component Value Date/Time   CHOL 155 08/22/2019 2345   TRIG 110 08/22/2019 2345   HDL 47  08/22/2019 2345   CHOLHDL 3.3 08/22/2019 2345   VLDL 22 08/22/2019 2345   LDLCALC 86 08/22/2019 2345  August 2023: Hemoglobin 15, platelets 176, BUN 16, creatinine 1.2, potassium 5.3, AST 24, ALT 12, cholesterol 168, triglycerides 165, HDL 37, LDL 102, TSH 2.73  Other Studies Reviewed Today:  Echocardiogram 03/14/2020:  1. Left ventricular ejection fraction, by estimation, is 60 to 65%. The  left ventricle has normal function. The left ventricle has no regional  wall motion abnormalities. Left ventricular diastolic parameters are  consistent with Grade I diastolic  dysfunction (impaired relaxation).   2. Right ventricular systolic function is normal. The right ventricular  size is normal. There is normal pulmonary artery systolic pressure.   3. Alfieri edge-to-edge approximation of A2 scallop to P2 scallop. The  mitral valve has been repaired/replaced. Trivial mitral valve  regurgitation. No evidence of mitral stenosis.   4. The aortic valve has an indeterminant number of cusps. Aortic valve  regurgitation is not visualized. No aortic stenosis is present.   5. Aortic dilatation noted. There is mild dilatation of the aortic root  measuring 40 mm.    Carotid Dopplers 08/23/2021: IMPRESSION: Minor carotid atherosclerosis. Negative for stenosis. Degree of narrowing less than 50% bilaterally by ultrasound criteria.   Patent antegrade vertebral flow bilaterally  Assessment and Plan:  1.  History of type A aortic dissection status post emergent repair with 30 mm Hemashield graft and resuspension of the aortic valve.  He is asymptomatic at this time and has good blood pressure control on Toprol-XL and Zestril.  No changes were made today.  2.  Severe mitral regurgitation status post Alfieri stitch at the time of type A aortic dissection repair.  Plan follow-up echocardiogram in 6 months for his next visit.  Medication Adjustments/Labs and Tests Ordered: Current medicines are reviewed at  length with the patient today.  Concerns regarding medicines are outlined above.   Tests Ordered: Orders Placed This Encounter  Procedures   ECHOCARDIOGRAM COMPLETE    Medication Changes: No orders of the defined types were placed in this encounter.   Disposition:  Follow up  6 months.  Signed, Satira Sark, MD, [facility] 07/06/2022 10:33 AM    [place] at [place]. [address] [place], [place] [address] Phone: [phone]; Fax: [phone]

## 2022-07-06 NOTE — Patient Instructions (Signed)
Medication Instructions:  Your physician recommends that you continue on your current medications as directed. Please refer to the Current Medication list given to you today.   Labwork: None today  Testing/Procedures: Your physician has requested that you have an echocardiogram in 6 MONTHS. Echocardiography is a painless test that uses sound waves to create images of your heart. It provides your doctor with information about the size and shape of your heart and how well your heart's chambers and valves are working. This procedure takes approximately one hour. There are no restrictions for this procedure. Please do NOT wear cologne, perfume, aftershave, or lotions (deodorant is allowed). Please arrive 15 minutes prior to your appointment time.   Follow-Up: 6 months  Any Other Special Instructions Will Be Listed Below (If Applicable).  If you need a refill on your cardiac medications before your next appointment, please call your pharmacy.

## 2022-07-23 DIAGNOSIS — L570 Actinic keratosis: Secondary | ICD-10-CM | POA: Diagnosis not present

## 2022-07-23 DIAGNOSIS — D485 Neoplasm of uncertain behavior of skin: Secondary | ICD-10-CM | POA: Diagnosis not present

## 2022-08-09 DIAGNOSIS — C44329 Squamous cell carcinoma of skin of other parts of face: Secondary | ICD-10-CM | POA: Diagnosis not present

## 2022-09-27 DIAGNOSIS — K080 Exfoliation of teeth due to systemic causes: Secondary | ICD-10-CM | POA: Diagnosis not present

## 2022-10-05 DIAGNOSIS — H6122 Impacted cerumen, left ear: Secondary | ICD-10-CM | POA: Diagnosis not present

## 2022-11-14 DIAGNOSIS — Z6827 Body mass index (BMI) 27.0-27.9, adult: Secondary | ICD-10-CM | POA: Diagnosis not present

## 2022-11-14 DIAGNOSIS — J019 Acute sinusitis, unspecified: Secondary | ICD-10-CM | POA: Diagnosis not present

## 2023-01-07 ENCOUNTER — Other Ambulatory Visit (HOSPITAL_COMMUNITY): Payer: Medicare Other

## 2023-01-07 ENCOUNTER — Ambulatory Visit (HOSPITAL_COMMUNITY)
Admission: RE | Admit: 2023-01-07 | Discharge: 2023-01-07 | Disposition: A | Discharge status: Home / Self Care | Payer: Medicare Other | Source: Ambulatory Visit | Attending: Cardiology | Admitting: Cardiology

## 2023-01-07 DIAGNOSIS — I10 Essential (primary) hypertension: Secondary | ICD-10-CM | POA: Diagnosis not present

## 2023-01-07 DIAGNOSIS — Z9889 Other specified postprocedural states: Secondary | ICD-10-CM | POA: Diagnosis not present

## 2023-01-07 DIAGNOSIS — I351 Nonrheumatic aortic (valve) insufficiency: Secondary | ICD-10-CM | POA: Diagnosis not present

## 2023-01-07 DIAGNOSIS — Z954 Presence of other heart-valve replacement: Secondary | ICD-10-CM | POA: Diagnosis not present

## 2023-01-07 DIAGNOSIS — I4891 Unspecified atrial fibrillation: Secondary | ICD-10-CM | POA: Diagnosis not present

## 2023-01-07 LAB — ECHOCARDIOGRAM COMPLETE
AR max vel: 2.55 cm2
AV Area VTI: 2.74 cm2
AV Area mean vel: 2.3 cm2
AV Mean grad: 3 mmHg
AV Peak grad: 5.4 mmHg
Ao pk vel: 1.16 m/s
Area-P 1/2: 2.26 cm2
MV VTI: 2.23 cm2
S' Lateral: 3 cm

## 2023-01-07 NOTE — Progress Notes (Signed)
*  PRELIMINARY RESULTS* Echocardiogram 2D Echocardiogram has been performed.  Carolyne Fiscal 01/07/2023, 12:31 PM

## 2023-01-11 ENCOUNTER — Other Ambulatory Visit: Payer: Self-pay | Admitting: Cardiology

## 2023-01-29 DIAGNOSIS — L570 Actinic keratosis: Secondary | ICD-10-CM | POA: Diagnosis not present

## 2023-02-08 ENCOUNTER — Ambulatory Visit: Payer: Medicare Other | Admitting: Nurse Practitioner

## 2023-02-08 ENCOUNTER — Encounter: Payer: Self-pay | Admitting: Nurse Practitioner

## 2023-02-08 VITALS — BP 100/70 | HR 57 | Ht 72.0 in | Wt 186.8 lb

## 2023-02-08 DIAGNOSIS — Z9889 Other specified postprocedural states: Secondary | ICD-10-CM | POA: Diagnosis not present

## 2023-02-08 DIAGNOSIS — I77819 Aortic ectasia, unspecified site: Secondary | ICD-10-CM

## 2023-02-08 DIAGNOSIS — I351 Nonrheumatic aortic (valve) insufficiency: Secondary | ICD-10-CM | POA: Diagnosis not present

## 2023-02-08 DIAGNOSIS — I10 Essential (primary) hypertension: Secondary | ICD-10-CM

## 2023-02-08 MED ORDER — LISINOPRIL 10 MG PO TABS: 10.0000 mg | ORAL_TABLET | Freq: Every day | ORAL | 3 refills | Status: DC

## 2023-02-08 NOTE — Patient Instructions (Addendum)
Medication Instructions:  Your physician has recommended you make the following change in your medication:  Reduce Lisinopril to 10 Mg daily Continue all other medications as prescribed.  Labwork: none  Testing/Procedures: none  Follow-Up: Your physician recommends that you schedule a follow-up appointment in: 6 Months with Diona Browner   Any Other Special Instructions Will Be Listed Below (If Applicable).  If you need a refill on your cardiac medications before your next appointment, please call your pharmacy.

## 2023-02-08 NOTE — Progress Notes (Unsigned)
Cardiology Office Note:  .   Date:  02/08/2023 ID:  Jerlyn Ly, DOB [birth year], MRN [id number] PCP: Lianne Moris, PA-C  [facility] Providers Cardiologist:  Nona Dell, MD    History of Present Illness: .   Henry Hayden is a 72 y.o. male with a PMH of type a aortic dissection, s/p emergent repair with 30 mm Hemashield graft and resuspension of aortic valve, history of severe mitral regurgitation, s/p Alfieri stitch at time of aortic dissection repair, HTN, and post-op A-fib, who presents today for follow-up.   Last seen by Dr. Diona Browner on July 06, 2022.  Was doing very well at that time.  Updated echocardiogram January 07, 2023 revealed normal LVEF, stable mitral repair.  Today he presents for follow-up.  He states he is doing well. Wife states he recently had some vision issues. Went to see a retinal specialist, had a normal exam. Has had recent low BP readings with SBP running in 90's per wife's report, however pt states he feels fine. Overall doing well from a cardiac perspective. Denies any chest pain, shortness of breath, palpitations, syncope, presyncope, dizziness, orthopnea, PND, swelling or significant weight changes, acute bleeding, or claudication.  SH: Married since 1969, has 11 great grandchildren ranging in age from 12 years old to 73 year old  Studies Reviewed: Marland Kitchen    EKG:  EKG Interpretation Date/Time:  Friday February 08 2023 10:53:28 EDT Ventricular Rate:  59 PR Interval:  170 QRS Duration:  102 QT Interval:  410 QTC Calculation: 405 R Axis:   83  Text Interpretation: Sinus bradycardia with Premature atrial complexes When compared with ECG of 31-Aug-2019 11:49, Sinus rhythm has replaced Atrial fibrillation Vent. rate has decreased BY  76 BPM Criteria for Septal infarct are no longer Present Confirmed by Sharlene Dory [id number]) on 02/08/2023 10:56:42 AM   Echo 12/2022:  1. Left ventricular ejection fraction, by estimation, is 60 to 65%. The  left  ventricle has normal function. The left ventricle has no regional  wall motion abnormalities. Left ventricular diastolic parameters are  consistent with Grade I diastolic  dysfunction (impaired relaxation).   2. Right ventricular systolic function is normal. The right ventricular  size is mildly enlarged.   3. Right atrial size was mild to moderately dilated.   4. Alfieri edge-to-edge approximation of A2 scallop to P2 scallop. The  mitral valve has been repaired/replaced. Trivial mitral valve  regurgitation. No evidence of mitral stenosis.   5. The aortic valve is tricuspid. Aortic valve regurgitation is mild. No  aortic stenosis is present.   6. Aortic dilatation noted. There is mild dilatation of the aortic root,  measuring 41 mm. There is borderline dilatation of the ascending aorta,  measuring 36 mm.   Comparison(s): Changes from prior study are noted. Aortic regurgitation is  new, mild now.  Physical Exam:   VS:  BP 100/70   Pulse (!) 57   Ht 6' (1.829 m)   Wt 186 lb 12.8 oz (84.7 kg)   SpO2 96%   BMI 25.33 kg/m    Wt Readings from Last 3 Encounters:  02/08/23 186 lb 12.8 oz (84.7 kg)  07/06/22 181 lb 6.4 oz (82.3 kg)  01/09/22 179 lb 12.8 oz (81.6 kg)    GEN: Well nourished, well developed in no acute distress NECK: No JVD; No carotid bruits CARDIAC: S1/S2, RRR, no murmurs, rubs, gallops RESPIRATORY:  Clear to auscultation without rales, wheezing or rhonchi  ABDOMEN: Soft, non-tender, non-distended EXTREMITIES:  No edema; No deformity   ASSESSMENT AND PLAN: .    Hx of aortic dissection, s/p emergent repair and resuspension of aortic valve, aortic dilatation, with mild aortic valve regurgitation TTE 12/2022 revealed mild dilatation of aortic root measuring 41 mm with borderline dilatation of ascending aorta, measuring 36 mm. Denies any symptoms. Recommend repeating imaging in 1 year for monitoring. Care and ED precautions discussed. Continue to follow with PCP.  Hx of  severe MR, s/p Alfieri stitch along with aortic dissection repair TTE 12/2022 revealed stable mitral repair. Denies any symptoms. Plan to update Echo in 1 year as mentioned above. Care and ED precautions discussed. Continue to follow with PCP.  3. HTN Wife admits to some occasionally low SBP readings in the 90's, overall pt asymptomatic and denies any red flag symptoms. BP soft today. Will reduce lisinopril to 10 mg daily from 20 mg daily. Discussed SBP goal < 140. No other medication changes at this time. Discussed to monitor BP at home at least 2 hours after medications and sitting for 5-10 minutes. Heart healthy diet and regular cardiovascular exercise encouraged.   Dispo: Follow-up with Dr. Diona Browner or APP in 6 months.   Signed, Sharlene Dory, NP

## 2023-03-14 DIAGNOSIS — I10 Essential (primary) hypertension: Secondary | ICD-10-CM | POA: Diagnosis not present

## 2023-03-14 DIAGNOSIS — Z0000 Encounter for general adult medical examination without abnormal findings: Secondary | ICD-10-CM | POA: Diagnosis not present

## 2023-03-14 DIAGNOSIS — R5383 Other fatigue: Secondary | ICD-10-CM | POA: Diagnosis not present

## 2023-03-14 DIAGNOSIS — Z13220 Encounter for screening for lipoid disorders: Secondary | ICD-10-CM | POA: Diagnosis not present

## 2023-03-14 DIAGNOSIS — K219 Gastro-esophageal reflux disease without esophagitis: Secondary | ICD-10-CM | POA: Diagnosis not present

## 2023-03-14 DIAGNOSIS — Z1329 Encounter for screening for other suspected endocrine disorder: Secondary | ICD-10-CM | POA: Diagnosis not present

## 2023-03-14 DIAGNOSIS — Z125 Encounter for screening for malignant neoplasm of prostate: Secondary | ICD-10-CM | POA: Diagnosis not present

## 2023-03-20 DIAGNOSIS — Z0000 Encounter for general adult medical examination without abnormal findings: Secondary | ICD-10-CM | POA: Diagnosis not present

## 2023-03-20 DIAGNOSIS — I10 Essential (primary) hypertension: Secondary | ICD-10-CM | POA: Diagnosis not present

## 2023-03-20 DIAGNOSIS — E7849 Other hyperlipidemia: Secondary | ICD-10-CM | POA: Diagnosis not present

## 2023-03-20 DIAGNOSIS — K219 Gastro-esophageal reflux disease without esophagitis: Secondary | ICD-10-CM | POA: Diagnosis not present

## 2023-04-03 DIAGNOSIS — K080 Exfoliation of teeth due to systemic causes: Secondary | ICD-10-CM | POA: Diagnosis not present

## 2023-04-24 ENCOUNTER — Ambulatory Visit: Payer: Medicare Other | Admitting: Cardiology

## 2023-05-20 ENCOUNTER — Emergency Department (HOSPITAL_COMMUNITY)
Admission: EM | Admit: 2023-05-20 | Discharge: 2023-05-20 | Disposition: A | Discharge status: Home / Self Care | Payer: Medicare Other | Attending: Emergency Medicine | Admitting: Emergency Medicine

## 2023-05-20 ENCOUNTER — Encounter (HOSPITAL_COMMUNITY): Payer: Self-pay

## 2023-05-20 ENCOUNTER — Other Ambulatory Visit: Payer: Self-pay

## 2023-05-20 DIAGNOSIS — Z79899 Other long term (current) drug therapy: Secondary | ICD-10-CM | POA: Diagnosis not present

## 2023-05-20 DIAGNOSIS — I10 Essential (primary) hypertension: Secondary | ICD-10-CM | POA: Diagnosis not present

## 2023-05-20 DIAGNOSIS — Z7982 Long term (current) use of aspirin: Secondary | ICD-10-CM | POA: Insufficient documentation

## 2023-05-20 DIAGNOSIS — R040 Epistaxis: Secondary | ICD-10-CM | POA: Insufficient documentation

## 2023-05-20 MED ORDER — OXYMETAZOLINE HCL 0.05 % NA SOLN
1.0000 | Freq: Once | NASAL | Status: AC
  Administered 2023-05-20: 1 via NASAL
  Filled 2023-05-20: qty 30

## 2023-05-20 MED ORDER — SILVER NITRATE-POT NITRATE 75-25 % EX MISC
1.0000 | Freq: Once | CUTANEOUS | Status: AC
  Administered 2023-05-20: 1 via TOPICAL
  Filled 2023-05-20: qty 10

## 2023-05-20 NOTE — ED Triage Notes (Addendum)
Pt with nose bleed x3 hours. Pt takes baby aspirin. Pt has had many nose bleeds over the last 10 days. Small amounts of bleeding noted.

## 2023-05-20 NOTE — Discharge Instructions (Addendum)
You have been seen today for your complaint of nosebleed. Your discharge medications include Afrin.  Apply 1 spray to a tissue paper, place in the nostril that is bleeding and apply pressure for 10 to 15 minutes if your bleeding returns.  If you need to use this more than once in a day, you should return to the emergency department. Home care instructions are as follows:  Apply emollient such as Aquaphor or Vaseline to your nose daily to help prevent recurrence of bleeding.  You should also use a humidifier in your bed room. Follow up with: Your primary care provider in 1 week for reevaluation Please seek immediate medical care if you develop any of the following symptoms: You have a nosebleed after a fall or a head injury. Your nosebleed does not go away after 20 minutes. You feel dizzy or weak. You have unusual bleeding from other parts of your body. You have unusual bruising on other parts of your body. You become sweaty. You vomit blood. At this time there does not appear to be the presence of an emergent medical condition, however there is always the potential for conditions to change. Please read and follow the below instructions.  Do not take your medicine if  develop an itchy rash, swelling in your mouth or lips, or difficulty breathing; call 911 and seek immediate emergency medical attention if this occurs.  You may review your lab tests and imaging results in their entirety on your MyChart account.  Please discuss all results of fully with your primary care provider and other specialist at your follow-up visit.  Note: Portions of this text may have been transcribed using voice recognition software. Every effort was made to ensure accuracy; however, inadvertent computerized transcription errors may still be present.

## 2023-05-20 NOTE — ED Provider Notes (Signed)
[place] EMERGENCY DEPARTMENT AT [facility] Provider Note   CSN: [id number] Arrival date & time: 05/20/23  2056     History  Chief Complaint  Patient presents with   Epistaxis    Henry Hayden is a 72 y.o. male.  With a history of hypertension, aortic dissection status post aortic dissection repair, mitral regurgitation presenting to the ED for evaluation of epistaxis.  He states he had an episode of epistaxis every other day for the past 10 days.  He really had any episodes of epistaxis prior to this.  He does not take any anticoagulation besides a baby aspirin.  He denies any trauma.  He states his nosebleed today began after an episode of sneezing.  Presents today because he did not want to go to sleep without making sure the nosebleed would not return.  He reports getting the nosebleed to stop with nasal packing and direct pressure prior to arrival.  He denies any signs or symptoms of syncope including fatigue, lightheadedness, shortness of breath.  He states his current episode of epistaxis has been intermittent over the past 3 hours.   Epistaxis      Home Medications Prior to Admission medications   Medication Sig Start Date End Date Taking? Authorizing Provider  amoxicillin (AMOXIL) 500 MG capsule Take 4 capsules by mouth. Prior to dental work. 11/30/20   [provider]  aspirin EC 81 MG tablet Take 81 mg by mouth daily.    [provider]  Cholecalciferol (VITAMIN D) 50 MCG (2000 UT) tablet Take 2,000 Units by mouth daily.    [provider]  lisinopril (ZESTRIL) 10 MG tablet Take 1 tablet (10 mg total) by mouth daily. 02/08/23 05/09/23  Sharlene Dory, NP  metoprolol succinate (TOPROL-XL) 25 MG 24 hr tablet TAKE 1 TABLET BY MOUTH EVERY DAY 01/11/23   Jonelle Sidle, MD  Multiple Vitamin (MULTIVITAMIN WITH MINERALS) TABS tablet Take 1 tablet by mouth daily.    [provider]  omeprazole (PRILOSEC) 40 MG capsule Take 1  capsule (40 mg total) by mouth daily. 05/13/20   Dolores Frame, MD  Saw Palmetto 450 MG CAPS Take 450 mg by mouth daily.    [provider]      Allergies    Patient has no known allergies.    Review of Systems   Review of Systems  HENT:  Positive for nosebleeds.   All other systems reviewed and are negative.   Physical Exam Updated Vital Signs BP 116/83   Pulse 62   Temp 98.2 F (36.8 C) (Oral)   Resp 16   SpO2 95%  Physical Exam Vitals and nursing note reviewed.  Constitutional:      General: He is not in acute distress.    Appearance: Normal appearance. He is normal weight. He is not ill-appearing.  HENT:     Head: Normocephalic and atraumatic.     Nose:     Comments: Right nare clear.  Left nare with small scab to the Kiesselbach plexus    Mouth/Throat:     Mouth: Mucous membranes are moist.     Pharynx: Oropharynx is clear.     Comments: No posterior pharyngeal bleeding Pulmonary:     Effort: Pulmonary effort is normal. No respiratory distress.  Abdominal:     General: Abdomen is flat.  Musculoskeletal:        General: Normal range of motion.     Cervical back: Neck supple.  Skin:  General: Skin is warm and dry.  Neurological:     Mental Status: He is alert and oriented to person, place, and time.  Psychiatric:        Mood and Affect: Mood normal.        Behavior: Behavior normal.     ED Results / Procedures / Treatments   Labs (all labs ordered are listed, but only abnormal results are displayed) Labs Reviewed - No data to display  EKG None  Radiology No results found.  Procedures .Epistaxis Management  Date/Time: 05/20/2023 9:13 PM  Performed by: Michelle Piper, PA-C Authorized by: Michelle Piper, PA-C   Consent:    Consent obtained:  Verbal   Consent given by:  Patient and spouse   Risks discussed:  Nasal injury, pain and bleeding   Alternatives discussed:  No treatment Universal protocol:    Procedure  explained and questions answered to patient or proxy's satisfaction: yes     Relevant documents present and verified: yes     Required blood products, implants, devices, and special equipment available: yes     Immediately prior to procedure, a time out was called: yes     Patient identity confirmed:  Verbally with patient and arm band Procedure details:    Treatment site:  L anterior   Treatment method:  Silver nitrate   Treatment complexity:  Limited   Treatment episode: initial   Post-procedure details:    Assessment:  Bleeding stopped   Procedure completion:  Tolerated well, no immediate complications     Medications Ordered in ED Medications  oxymetazoline (AFRIN) 0.05 % nasal spray 1 spray (1 spray Each Nare Given 05/20/23 2131)  silver nitrate applicators applicator 1 Stick (1 Stick Topical Given 05/20/23 2155)    ED Course/ Medical Decision Making/ A&P                                 Medical Decision Making Risk OTC drugs. Prescription drug management.  This patient presents to the ED for concern of epistaxis, this involves an extensive number of treatment options, and is a complaint that carries with it a high risk of complications and morbidity.    My initial workup includes symptom control, cautery  Additional history obtained from: Nursing notes from this visit. Family wife at bedside provides a portion of the history  Afebrile, hemodynamically stable.  72 year old male presenting for evaluation of left-sided epistaxis.  This episode has been intermittent over the past 3 hours.  He has had 5 episodes over the past 10 days, 4 on the left and 1 on the right.  He is not anticoagulated.  Initially presented to the emergency department with packing in his left nostril.  No bleeding of the right nostril.  Posterior oropharynx was observed without any blood.  Nasal packing was removed and replaced with Afrin impregnated gauze.  This stopped the bleeding.  This was removed  and the left nare was cauterized with silver nitrate.  Bleeding did not return after this.  Patient was encouraged to use Afrin at home only sparingly given his history of aortic dissection.  His blood pressure is well-controlled here today.  He declined labs to monitor for hemoglobin.  He denied any signs or symptoms of anemia.  He denies significant blood loss.  He was encouraged to follow-up with his primary care provider in 1 week for reevaluation.  He was encouraged to use humidifiers  and emollients for his symptoms.  He was given return precautions.  Stable at discharge.  At this time there does not appear to be any evidence of an acute emergency medical condition and the patient appears stable for discharge with appropriate outpatient follow up. Diagnosis was discussed with patient who verbalizes understanding of care plan and is agreeable to discharge. I have discussed return precautions with patient and wife who verbalizes understanding. Patient encouraged to follow-up with their PCP within 1 week. All questions answered.  Note: Portions of this report may have been transcribed using voice recognition software. Every effort was made to ensure accuracy; however, inadvertent computerized transcription errors may still be present.        Final Clinical Impression(s) / ED Diagnoses Final diagnoses:  Anterior epistaxis    Rx / DC Orders ED Discharge Orders     None         Mora Bellman 05/20/23 2253    Bethann Berkshire, MD 05/21/23 1114

## 2023-05-22 DIAGNOSIS — R040 Epistaxis: Secondary | ICD-10-CM | POA: Diagnosis not present

## 2023-05-23 DIAGNOSIS — J340 Abscess, furuncle and carbuncle of nose: Secondary | ICD-10-CM | POA: Diagnosis not present

## 2023-05-23 DIAGNOSIS — R040 Epistaxis: Secondary | ICD-10-CM | POA: Diagnosis not present

## 2023-05-28 DIAGNOSIS — R040 Epistaxis: Secondary | ICD-10-CM | POA: Diagnosis not present

## 2023-06-06 DIAGNOSIS — J301 Allergic rhinitis due to pollen: Secondary | ICD-10-CM | POA: Diagnosis not present

## 2023-06-06 DIAGNOSIS — R040 Epistaxis: Secondary | ICD-10-CM | POA: Diagnosis not present

## 2023-06-27 DIAGNOSIS — R040 Epistaxis: Secondary | ICD-10-CM | POA: Diagnosis not present

## 2023-07-04 DIAGNOSIS — H35033 Hypertensive retinopathy, bilateral: Secondary | ICD-10-CM | POA: Diagnosis not present

## 2023-08-01 DIAGNOSIS — L570 Actinic keratosis: Secondary | ICD-10-CM | POA: Diagnosis not present

## 2023-08-07 ENCOUNTER — Ambulatory Visit: Payer: Medicare Other | Attending: Cardiology | Admitting: Cardiology

## 2023-08-07 ENCOUNTER — Ambulatory Visit: Payer: Medicare Other | Admitting: Cardiology

## 2023-08-07 ENCOUNTER — Encounter: Payer: Self-pay | Admitting: Cardiology

## 2023-08-07 VITALS — BP 108/66 | HR 70 | Ht 72.0 in | Wt 182.0 lb

## 2023-08-07 DIAGNOSIS — Z9889 Other specified postprocedural states: Secondary | ICD-10-CM | POA: Diagnosis not present

## 2023-08-07 DIAGNOSIS — I10 Essential (primary) hypertension: Secondary | ICD-10-CM

## 2023-08-07 NOTE — Patient Instructions (Signed)
Medication Instructions:  Your physician recommends that you continue on your current medications as directed. Please refer to the Current Medication list given to you today.   Labwork: None today  Testing/Procedures: None today  Follow-Up: 1 year  Any Other Special Instructions Will Be Listed Below (If Applicable).  If you need a refill on your cardiac medications before your next appointment, please call your pharmacy.  

## 2023-08-07 NOTE — Progress Notes (Signed)
Cardiology Office Note  Date: 08/07/2023   ID: Henry Hayden, DOB 1951-03-30, MRN [id number]  History of Present Illness: Henry Hayden is a 73 y.o. male last seen in July 2024 by Ms. Philis Nettle NP, I reviewed the note.  He is here with his wife for a follow-up visit.  Continues to do well, reports no exertional chest pain or unusual shortness of breath with typical activities.  He has not been getting outdoors as much during the very cold weather.  Prior to that was walking about 2 miles most days.  He does not describe any palpitations, no dizziness or syncope.  I reviewed his medications.  Current regimen includes aspirin, lisinopril, and Toprol-XL.  He reports compliance with therapy.  Blood pressure today is well-controlled.  I reviewed his lab work from August 2024.  He had an echocardiogram in June 2024 as noted below.  Physical Exam: VS:  BP 108/66 (BP Location: Right Arm, Patient Position: Sitting, Cuff Size: Normal)   Pulse 70   Ht 6' (1.829 m)   Wt 182 lb (82.6 kg)   SpO2 98%   BMI 24.68 kg/m , BMI Body mass index is 24.68 kg/m.  Wt Readings from Last 3 Encounters:  08/07/23 182 lb (82.6 kg)  02/08/23 186 lb 12.8 oz (84.7 kg)  07/06/22 181 lb 6.4 oz (82.3 kg)    General: Patient appears comfortable at rest. HEENT: Conjunctiva and lids normal. Neck: Supple, no elevated JVP or carotid bruits. Lungs: Clear to auscultation, nonlabored breathing at rest. Cardiac: Regular rate and rhythm, no S3 or significant systolic murmur. Extremities: No pitting edema.  ECG:  An ECG dated 02/08/2023 was personally reviewed today and demonstrated:  Sinus bradycardia with PAC.  Labwork:  August 2024: Hemoglobin 14.4, platelets 193, potassium 5.2, BUN 9, creatinine 1.34, GFR 56, AST 24, ALT 14, cholesterol 146, triglycerides 164, HDL 38, LDL 75, TSH 2.689  Other Studies Reviewed Today:  Echocardiogram 01/07/2023:  1. Left ventricular ejection fraction, by estimation, is 60 to 65%. The   left ventricle has normal function. The left ventricle has no regional  wall motion abnormalities. Left ventricular diastolic parameters are  consistent with Grade I diastolic  dysfunction (impaired relaxation).   2. Right ventricular systolic function is normal. The right ventricular  size is mildly enlarged.   3. Right atrial size was mild to moderately dilated.   4. Alfieri edge-to-edge approximation of A2 scallop to P2 scallop. The  mitral valve has been repaired/replaced. Trivial mitral valve  regurgitation. No evidence of mitral stenosis.   5. The aortic valve is tricuspid. Aortic valve regurgitation is mild. No  aortic stenosis is present.   6. Aortic dilatation noted. There is mild dilatation of the aortic root,  measuring 41 mm. There is borderline dilatation of the ascending aorta,  measuring 36 mm.   Assessment and Plan:  1.  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  He remains symptomatically stable with good blood pressure control on current regimen.  2.  History of severe mitral regurgitation status post Alfieri stitch repair at time of aortic dissection repair in February 2021.  Follow-up echocardiogram in June 2024 showed stable repair with trivial mitral regurgitation.  LVEF 60 to 65%.  3.  Primary hypertension.  Blood pressure well-controlled today.  Continue Toprol-XL and lisinopril.  Disposition:  Follow up  1 year, sooner if needed.  Signed, Jonelle Sidle, M.D., F.A.C.C. [facility]

## 2023-10-09 DIAGNOSIS — K080 Exfoliation of teeth due to systemic causes: Secondary | ICD-10-CM | POA: Diagnosis not present

## 2024-01-03 ENCOUNTER — Other Ambulatory Visit: Payer: Self-pay | Admitting: Cardiology

## 2024-02-12 DIAGNOSIS — L570 Actinic keratosis: Secondary | ICD-10-CM | POA: Diagnosis not present

## 2024-02-12 DIAGNOSIS — D485 Neoplasm of uncertain behavior of skin: Secondary | ICD-10-CM | POA: Diagnosis not present

## 2024-02-12 DIAGNOSIS — D0439 Carcinoma in situ of skin of other parts of face: Secondary | ICD-10-CM | POA: Diagnosis not present

## 2024-02-27 DIAGNOSIS — C44329 Squamous cell carcinoma of skin of other parts of face: Secondary | ICD-10-CM | POA: Diagnosis not present

## 2024-03-20 DIAGNOSIS — Z125 Encounter for screening for malignant neoplasm of prostate: Secondary | ICD-10-CM | POA: Diagnosis not present

## 2024-03-20 DIAGNOSIS — Z130 Encounter for screening for diseases of the blood and blood-forming organs and certain disorders involving the immune mechanism: Secondary | ICD-10-CM | POA: Diagnosis not present

## 2024-03-20 DIAGNOSIS — I10 Essential (primary) hypertension: Secondary | ICD-10-CM | POA: Diagnosis not present

## 2024-03-20 DIAGNOSIS — Z1389 Encounter for screening for other disorder: Secondary | ICD-10-CM | POA: Diagnosis not present

## 2024-03-20 DIAGNOSIS — Z1329 Encounter for screening for other suspected endocrine disorder: Secondary | ICD-10-CM | POA: Diagnosis not present

## 2024-03-20 DIAGNOSIS — E7849 Other hyperlipidemia: Secondary | ICD-10-CM | POA: Diagnosis not present

## 2024-03-26 DIAGNOSIS — Z1389 Encounter for screening for other disorder: Secondary | ICD-10-CM | POA: Diagnosis not present

## 2024-03-26 DIAGNOSIS — Z6827 Body mass index (BMI) 27.0-27.9, adult: Secondary | ICD-10-CM | POA: Diagnosis not present

## 2024-03-26 DIAGNOSIS — K219 Gastro-esophageal reflux disease without esophagitis: Secondary | ICD-10-CM | POA: Diagnosis not present

## 2024-03-26 DIAGNOSIS — I10 Essential (primary) hypertension: Secondary | ICD-10-CM | POA: Diagnosis not present

## 2024-03-26 DIAGNOSIS — Z1331 Encounter for screening for depression: Secondary | ICD-10-CM | POA: Diagnosis not present

## 2024-03-26 DIAGNOSIS — Z0001 Encounter for general adult medical examination with abnormal findings: Secondary | ICD-10-CM | POA: Diagnosis not present

## 2024-03-26 DIAGNOSIS — E7849 Other hyperlipidemia: Secondary | ICD-10-CM | POA: Diagnosis not present

## 2024-03-26 DIAGNOSIS — Z0000 Encounter for general adult medical examination without abnormal findings: Secondary | ICD-10-CM | POA: Diagnosis not present

## 2024-04-02 ENCOUNTER — Other Ambulatory Visit: Payer: Self-pay | Admitting: Nurse Practitioner

## 2024-05-18 ENCOUNTER — Other Ambulatory Visit: Payer: Self-pay | Admitting: Nurse Practitioner

## 2024-05-20 MED ORDER — LISINOPRIL 10 MG PO TABS: 10.0000 mg | ORAL_TABLET | Freq: Every day | ORAL | 0 refills | Status: AC

## 2024-06-09 DIAGNOSIS — S298XXA Other specified injuries of thorax, initial encounter: Secondary | ICD-10-CM | POA: Diagnosis not present

## 2024-06-09 DIAGNOSIS — Z6827 Body mass index (BMI) 27.0-27.9, adult: Secondary | ICD-10-CM | POA: Diagnosis not present

## 2024-07-03 DIAGNOSIS — H40022 Open angle with borderline findings, high risk, left eye: Secondary | ICD-10-CM | POA: Diagnosis not present

## 2024-08-19 ENCOUNTER — Encounter: Payer: Self-pay | Admitting: Family Medicine

## 2024-08-19 ENCOUNTER — Encounter: Payer: Self-pay | Admitting: Cardiology

## 2024-08-19 ENCOUNTER — Ambulatory Visit: Admitting: Cardiology

## 2024-08-19 VITALS — BP 118/78 | HR 65 | Ht 72.0 in | Wt 191.0 lb

## 2024-08-19 DIAGNOSIS — I10 Essential (primary) hypertension: Secondary | ICD-10-CM

## 2024-08-19 DIAGNOSIS — Z9889 Other specified postprocedural states: Secondary | ICD-10-CM

## 2024-08-19 NOTE — Progress Notes (Signed)
"  ° ° °  Cardiology Office Note  Date: 08/19/2024   ID: WENDEL HOMEYER, DOB 1950-10-07, MRN [id number]  History of Present Illness: CAYDE HELD is a 74 y.o. male last seen in January 2025.  He is here with his wife for a routine visit.  Reports no exertional chest pain, stays as active as he can with outside chores, does have to pace himself and rest more.  He does not report any palpitations or syncope.  We went over his medications.  Blood pressure well-controlled today on current regimen.  Reports compliance with therapy.  He continues to follow at [facility] for primary care, requesting interval lab work for review.  I reviewed his ECG today which shows sinus rhythm with decreased R wave progression, Q-wave lead III, nonspecific ST-T wave abnormalities.  Physical Exam: VS:  BP 118/78   Pulse 65   Ht 6' (1.829 m)   Wt 191 lb (86.6 kg)   SpO2 94%   BMI 25.90 kg/m , BMI Body mass index is 25.9 kg/m.  Wt Readings from Last 3 Encounters:  08/19/24 191 lb (86.6 kg)  08/07/23 182 lb (82.6 kg)  02/08/23 186 lb 12.8 oz (84.7 kg)    General: Patient appears comfortable at rest. HEENT: Conjunctiva and lids normal. Neck: Supple, no elevated JVP or carotid bruits. Lungs: Clear to auscultation, nonlabored breathing at rest. Cardiac: Regular rate and rhythm, no S3 or significant systolic murmur.  ECG:  An ECG dated 02/08/2023 was personally reviewed today and demonstrated:  Sinus bradycardia with PAC.  Labwork:  August 2024: Hemoglobin 14.4, platelets 193, potassium 5.2, BUN 9, creatinine 1.34, GFR 56, TSH 2.68, cholesterol 146, triglycerides 164, HDL 38, LDL 75  Other Studies Reviewed Today:  No interval cardiac testing for review today.  Assessment and Plan:  1.  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  Continues to do well, no chest pain, blood pressure well-controlled today.   2.  History of severe mitral  regurgitation status post Alfieri stitch repair at time of aortic dissection repair in February 2021.  No significant change on examination, plan to update echocardiogram for next visit in 1 year.   3.  Primary hypertension.  Blood pressure is well-controlled today.  Continue Toprol -XL 25 mg daily and lisinopril  10 mg daily.  Disposition:  Follow up 1 year.  Signed, Jayson JUDITHANN Sierras, M.D., F.A.C.C. [facility] "

## 2024-08-19 NOTE — Patient Instructions (Signed)
 Medication Instructions:   Your physician recommends that you continue on your current medications as directed. Please refer to the Current Medication list given to you today.   Labwork: None today  Testing/Procedures: Your physician has requested that you have an echocardiogram in 1 year . Echocardiography is a painless test that uses sound waves to create images of your heart. It provides your doctor with information about the size and shape of your heart and how well your heart's chambers and valves are working. This procedure takes approximately one hour. There are no restrictions for this procedure. Please do NOT wear cologne, perfume, aftershave, or lotions (deodorant is allowed). Please arrive 15 minutes prior to your appointment time.  Please note: We ask at that you not bring children with you during ultrasound (echo/ vascular) testing. Due to room size and safety concerns, children are not allowed in the ultrasound rooms during exams. Our front office staff cannot provide observation of children in our lobby area while testing is being conducted. An adult accompanying a patient to their appointment will only be allowed in the ultrasound room at the discretion of the ultrasound technician under special circumstances. We apologize for any inconvenience.   Follow-Up: 1 year Dr.McDowell  Any Other Special Instructions Will Be Listed Below (If Applicable).  If you need a refill on your cardiac medications before your next appointment, please call your pharmacy.

## 2025-08-19 ENCOUNTER — Other Ambulatory Visit (HOSPITAL_COMMUNITY)
